# Patient Record
Sex: Female | Born: 1995 | Race: White | Hispanic: No | Marital: Single | Smoking: Never smoker
Health system: Southern US, Community
[De-identification: ages and names within clinical notes are randomized; demographics above are authoritative.]

## PROBLEM LIST (undated history)

## (undated) DIAGNOSIS — F419 Anxiety disorder, unspecified: Secondary | ICD-10-CM

## (undated) DIAGNOSIS — F32A Depression, unspecified: Secondary | ICD-10-CM

## (undated) DIAGNOSIS — K219 Gastro-esophageal reflux disease without esophagitis: Secondary | ICD-10-CM

## (undated) DIAGNOSIS — G43909 Migraine, unspecified, not intractable, without status migrainosus: Secondary | ICD-10-CM

## (undated) DIAGNOSIS — R55 Syncope and collapse: Secondary | ICD-10-CM

## (undated) DIAGNOSIS — F329 Major depressive disorder, single episode, unspecified: Secondary | ICD-10-CM

## (undated) DIAGNOSIS — Z87440 Personal history of urinary (tract) infections: Secondary | ICD-10-CM

## (undated) HISTORY — DX: Personal history of urinary (tract) infections: Z87.440

## (undated) HISTORY — DX: Major depressive disorder, single episode, unspecified: F32.9

## (undated) HISTORY — DX: Gastro-esophageal reflux disease without esophagitis: K21.9

## (undated) HISTORY — PX: UPPER GASTROINTESTINAL ENDOSCOPY: SHX188

## (undated) HISTORY — DX: Depression, unspecified: F32.A

## (undated) HISTORY — PX: UPPER GI ENDOSCOPY: SHX6162

## (undated) HISTORY — DX: Syncope and collapse: R55

## (undated) HISTORY — DX: Migraine, unspecified, not intractable, without status migrainosus: G43.909

## (undated) HISTORY — DX: Anxiety disorder, unspecified: F41.9

---
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
## 2016-10-18 ENCOUNTER — Other Ambulatory Visit: Payer: Self-pay | Admitting: Family Medicine

## 2016-10-18 DIAGNOSIS — Z3041 Encounter for surveillance of contraceptive pills: Secondary | ICD-10-CM

## 2017-05-21 ENCOUNTER — Ambulatory Visit (INDEPENDENT_AMBULATORY_CARE_PROVIDER_SITE_OTHER): Payer: 59 | Admitting: Student

## 2017-05-21 VITALS — BP 119/84 | HR 108 | Wt 154.4 lb

## 2017-05-21 DIAGNOSIS — Z30430 Encounter for insertion of intrauterine contraceptive device: Secondary | ICD-10-CM

## 2017-05-21 DIAGNOSIS — Z113 Encounter for screening for infections with a predominantly sexual mode of transmission: Secondary | ICD-10-CM

## 2017-05-21 DIAGNOSIS — Z01419 Encounter for gynecological examination (general) (routine) without abnormal findings: Secondary | ICD-10-CM

## 2017-05-21 MED ORDER — LEVONORGESTREL 20 MCG/24HR IU IUD
INTRAUTERINE_SYSTEM | Freq: Once | INTRAUTERINE | Status: AC
  Administered 2017-05-21: 16:00:00 via INTRAUTERINE

## 2017-05-21 NOTE — Addendum Note (Signed)
Addended by: Cheree Ditto, Shermika Balthaser A on: 05/21/2017 04:28 PM   Modules accepted: Orders

## 2017-05-21 NOTE — Patient Instructions (Signed)

## 2017-05-21 NOTE — Addendum Note (Signed)
Addended by: Cheree Ditto, DEMETRICE A on: 05/21/2017 04:27 PM   Modules accepted: Orders

## 2017-05-21 NOTE — Progress Notes (Signed)
  Subjective:     Patient ID: Jamie Ruiz, female   DOB: 1995-10-10, 22 y.o.   MRN: [id number]  HPI Patient Jamie Ruiz is a 22 y.o. G0P0000 Non-pregnant female here for IUD placement. Denies abnormal discharge, dysuria, abnormal vaginal bleeding, vaginal lesions or other s/s of UTI or STI. Also requesting pap smear. She denies recent unprotected intercourse. She is not currently sexually active.    Review of Systems  Constitutional: Negative.   HENT: Negative.   Respiratory: Negative.   Cardiovascular: Negative.   Gastrointestinal: Negative.   Genitourinary: Positive for vaginal bleeding.  Musculoskeletal: Negative.   Neurological: Negative.   Hematological: Negative.   Psychiatric/Behavioral: Negative.        Objective:   Physical Exam  Constitutional: She appears well-developed.  HENT:  Head: Normocephalic.  Eyes: Pupils are equal, round, and reactive to light.  Neck: Normal range of motion.  Cardiovascular: Normal rate.  Pulmonary/Chest: Effort normal.  Abdominal: Soft.  Genitourinary: Vaginal discharge found.  Genitourinary Comments: NEFG; no lesions on vaginal walls. Bloody discharge consistent with menses; no clots, odor. Cervix is pink with no lesion; no CMT, suprapubic or adnexal tenderness.   Musculoskeletal: Normal range of motion.  Neurological: She is alert.  Skin: Skin is warm.       Assessment:     1. Encounter for gynecological examination   2. Encounter for insertion of intrauterine contraceptive device (IUD)     2. Pap smear done today; along with GC CT.       Plan:       GYNECOLOGY CLINIC PROCEDURE NOTE  Jamie Ruiz is a 22 y.o. G0P0000 here for Mirena IUD insertion. No GYN concerns.  Pap smear done today.   IUD Insertion Procedure Note Patient identified, informed consent performed, consent signed.   Discussed risks of irregular bleeding, cramping, infection, malpositioning or misplacement of the IUD outside the uterus which may require  further procedure such as laparoscopy. Time out was performed.  Urine pregnancy test negative.  Speculum placed in the vagina.  Cervix visualized.  Cleaned with Betadine x 2.  Grasped anteriorly with a single tooth tenaculum.  Uterus sounded to 7 cm.  Mirena IUD placed per manufacturer's recommendations.  Strings trimmed to 3 cm. Tenaculum was removed, good hemostasis noted.  Patient tolerated procedure well.   Patient was given post-procedure instructions.  She was advised to have backup contraception for one week.  Patient was also asked to check IUD strings periodically and follow up in 4 weeks for IUD check.   Jamie Ruiz CNM

## 2017-05-22 LAB — CYTOLOGY - PAP
Chlamydia: NEGATIVE
DIAGNOSIS: NEGATIVE
Neisseria Gonorrhea: NEGATIVE

## 2017-05-26 ENCOUNTER — Other Ambulatory Visit: Payer: Self-pay | Admitting: Primary Care

## 2017-05-26 DIAGNOSIS — Z0000 Encounter for general adult medical examination without abnormal findings: Secondary | ICD-10-CM

## 2017-06-06 ENCOUNTER — Other Ambulatory Visit (INDEPENDENT_AMBULATORY_CARE_PROVIDER_SITE_OTHER): Payer: 59

## 2017-06-06 DIAGNOSIS — Z0000 Encounter for general adult medical examination without abnormal findings: Secondary | ICD-10-CM | POA: Diagnosis not present

## 2017-06-06 LAB — COMPREHENSIVE METABOLIC PANEL
ALT: 13 U/L (ref 0–35)
AST: 18 U/L (ref 0–37)
Albumin: 4.4 g/dL (ref 3.5–5.2)
Alkaline Phosphatase: 55 U/L (ref 39–117)
BILIRUBIN TOTAL: 0.5 mg/dL (ref 0.2–1.2)
BUN: 11 mg/dL (ref 6–23)
CALCIUM: 9.5 mg/dL (ref 8.4–10.5)
CHLORIDE: 103 meq/L (ref 96–112)
CO2: 28 mEq/L (ref 19–32)
Creatinine, Ser: 0.79 mg/dL (ref 0.40–1.20)
GFR: 97.53 mL/min (ref >60.00)
GLUCOSE: 94 mg/dL (ref 70–99)
Potassium: 4 mEq/L (ref 3.5–5.1)
Sodium: 137 mEq/L (ref 135–145)
Total Protein: 7.1 g/dL (ref 6.0–8.3)

## 2017-06-06 LAB — LIPID PANEL
CHOL/HDL RATIO: 5
Cholesterol: 186 mg/dL (ref 0–200)
HDL: 38.3 mg/dL — AB (ref >39.00)
LDL Cholesterol: 133 mg/dL — ABNORMAL HIGH (ref 0–99)
NONHDL: 148.13
TRIGLYCERIDES: 76 mg/dL (ref 0.0–149.0)
VLDL: 15.2 mg/dL (ref 0.0–40.0)

## 2017-06-13 ENCOUNTER — Ambulatory Visit (INDEPENDENT_AMBULATORY_CARE_PROVIDER_SITE_OTHER): Payer: 59 | Admitting: Primary Care

## 2017-06-13 ENCOUNTER — Encounter: Payer: Self-pay | Admitting: Primary Care

## 2017-06-13 VITALS — BP 112/86 | HR 105 | Temp 98.2°F | Ht 67.0 in | Wt 153.2 lb

## 2017-06-13 DIAGNOSIS — Z0000 Encounter for general adult medical examination without abnormal findings: Secondary | ICD-10-CM

## 2017-06-13 DIAGNOSIS — Z23 Encounter for immunization: Secondary | ICD-10-CM | POA: Diagnosis not present

## 2017-06-13 DIAGNOSIS — F329 Major depressive disorder, single episode, unspecified: Secondary | ICD-10-CM

## 2017-06-13 DIAGNOSIS — F419 Anxiety disorder, unspecified: Secondary | ICD-10-CM

## 2017-06-13 DIAGNOSIS — F32A Depression, unspecified: Secondary | ICD-10-CM | POA: Insufficient documentation

## 2017-06-13 DIAGNOSIS — G43101 Migraine with aura, not intractable, with status migrainosus: Secondary | ICD-10-CM | POA: Diagnosis not present

## 2017-06-13 MED ORDER — SERTRALINE HCL 25 MG PO TABS: 25.0000 mg | ORAL_TABLET | Freq: Every day | ORAL | 1 refills | Status: DC

## 2017-06-13 NOTE — Patient Instructions (Signed)
You will be contacted regarding your referral to therapy.  Please let us know if you have not been contacted within one week.   Start sertraline (Zoloft) 25 mg tablets for anxiety and depression. Start by taking 1/2 tablet for 6 days, then increase to 1 full tablet thereafter.  Please schedule a follow up appointment in 6 weeks for re-evaluation. Message me sooner if you have any problems or questions.  It was a pleasure to see you today!   Preventive Care 18-39 Years, Female Preventive care refers to lifestyle choices and visits with your health care provider that can promote health and wellness. What does preventive care include?  A yearly physical exam. This is also called an annual well check.  Dental exams once or twice a year.  Routine eye exams. Ask your health care provider how often you should have your eyes checked.  Personal lifestyle choices, including: ? Daily care of your teeth and gums. ? Regular physical activity. ? Eating a healthy diet. ? Avoiding tobacco and drug use. ? Limiting alcohol use. ? Practicing safe sex. ? Taking vitamin and mineral supplements as recommended by your health care provider. What happens during an annual well check? The services and screenings done by your health care provider during your annual well check will depend on your age, overall health, lifestyle risk factors, and family history of disease. Counseling Your health care provider may ask you questions about your:  Alcohol use.  Tobacco use.  Drug use.  Emotional well-being.  Home and relationship well-being.  Sexual activity.  Eating habits.  Work and work Statistician.  Method of birth control.  Menstrual cycle.  Pregnancy history.  Screening You may have the following tests or measurements:  Height, weight, and BMI.  Diabetes screening. This is done by checking your blood sugar (glucose) after you have not eaten for a while (fasting).  Blood  pressure.  Lipid and cholesterol levels. These may be checked every 5 years starting at age 37.  Skin check.  Hepatitis C blood test.  Hepatitis B blood test.  Sexually transmitted disease (STD) testing.  BRCA-related cancer screening. This may be done if you have a family history of breast, ovarian, tubal, or peritoneal cancers.  Pelvic exam and Pap test. This may be done every 3 years starting at age 76. Starting at age 48, this may be done every 5 years if you have a Pap test in combination with an HPV test.  Discuss your test results, treatment options, and if necessary, the need for more tests with your health care provider. Vaccines Your health care provider may recommend certain vaccines, such as:  Influenza vaccine. This is recommended every year.  Tetanus, diphtheria, and acellular pertussis (Tdap, Td) vaccine. You may need a Td booster every 10 years.  Varicella vaccine. You may need this if you have not been vaccinated.  HPV vaccine. If you are 72 or younger, you may need three doses over 6 months.  Measles, mumps, and rubella (MMR) vaccine. You may need at least one dose of MMR. You may also need a second dose.  Pneumococcal 13-valent conjugate (PCV13) vaccine. You may need this if you have certain conditions and were not previously vaccinated.  Pneumococcal polysaccharide (PPSV23) vaccine. You may need one or two doses if you smoke cigarettes or if you have certain conditions.  Meningococcal vaccine. One dose is recommended if you are age 30-21 years and a first-year college student living in a residence hall, or if you  have one of several medical conditions. You may also need additional booster doses.  Hepatitis A vaccine. You may need this if you have certain conditions or if you travel or work in places where you may be exposed to hepatitis A.  Hepatitis B vaccine. You may need this if you have certain conditions or if you travel or work in places where you may  be exposed to hepatitis B.  Haemophilus influenzae type b (Hib) vaccine. You may need this if you have certain risk factors.  Talk to your health care provider about which screenings and vaccines you need and how often you need them. This information is not intended to replace advice given to you by your health care provider. Make sure you discuss any questions you have with your health care provider. Document Released: 02/26/2001 Document Revised: 09/20/2015 Document Reviewed: 11/01/2014 Elsevier Interactive Patient Education  [employer].

## 2017-06-13 NOTE — Progress Notes (Signed)
Subjective:    Patient ID: Jamie Ruiz, female    DOB: [birth year], 22 y.o.   MRN: [id number]  HPI  Jamie Ruiz is a 22 year old female who presents today for complete physical. She'd also like to discuss anxiety and depression.   Migraines occurring twice monthly on average and will last daily for several days, lasting 30 minutes to 2 hour increments. She's taking Advil with some improvement. Migraines have been present for the last several years, worse recently. She does work on a Astronomer for school. She's seen her optometrist and does wear contacts.  She's been struggling with anxiety and depression for years, but has never been able to really talk about how she feels with anyone. Her cousin is a therapist who recommended she come speak with someone about her symptoms.   Symptoms include "feeling numb inside", wants to be alone, feels nervous when in groups, little motivation to do anything, fatigue, difficulty sleeping, difficulty focusing. She also has a fear of living alone, is worried about someone being in her apartment. She'll double check the locks on her doors and will have to take pictures of the locked doors before she goes to bed.  GAD 7 score of 21 and PHQ 9 score of 21 today. She denies SI/HI.   Immunizations: -Tetanus: Completed in 2009, due today.   Diet: She endorses a healthy diet.  Breakfast: Protein shake with fruit, english muffin with peanut butter Lunch: Wraps, bagels  Dinner: Meat, vegetables, starch Snacks: Fruit Desserts: 1-2 twice weekly Beverages: Coffee, little water, Propel, flavored sparkling water  Exercise: She is not currently exercising, sometimes walks 2 miles Eye exam: Completed in 2019 Dental exam: Completes semi-annually  Pap Smear: Completed recently, normal.   Review of Systems  Constitutional: Negative for unexpected weight change.  HENT: Negative for rhinorrhea.   Respiratory: Negative for cough and shortness of breath.     Cardiovascular: Negative for chest pain.  Gastrointestinal: Negative for constipation and diarrhea.  Genitourinary: Negative for difficulty urinating and menstrual problem.  Musculoskeletal: Negative for arthralgias and myalgias.  Skin: Negative for rash.  Allergic/Immunologic: Negative for environmental allergies.  Neurological: Positive for headaches. Negative for dizziness and numbness.  Psychiatric/Behavioral: The patient is nervous/anxious.        See HPI       Past Medical History:  Diagnosis Date  . Fainting spell   . GERD (gastroesophageal reflux disease)   . History of UTI   . Migraines      Social History   Socioeconomic History  . Marital status: Single    Spouse name: Not on file  . Number of children: Not on file  . Years of education: Not on file  . Highest education level: Not on file  Occupational History  . Not on file  Social Needs  . Financial resource strain: Not on file  . Food insecurity:    Worry: Not on file    Inability: Not on file  . Transportation needs:    Medical: Not on file    Non-medical: Not on file  Tobacco Use  . Smoking status: Never Smoker  . Smokeless tobacco: Never Used  Substance and Sexual Activity  . Alcohol use: No    Alcohol/week: 0.0 oz  . Drug use: No  . Sexual activity: Yes    Partners: Male    Birth control/protection: Pill  Lifestyle  . Physical activity:    Days per week: Not on file    Minutes  per session: Not on file  . Stress: Not on file  Relationships  . Social connections:    Talks on phone: Not on file    Gets together: Not on file    Attends religious service: Not on file    Active member of club or organization: Not on file    Attends meetings of clubs or organizations: Not on file    Relationship status: Not on file  . Intimate partner violence:    Fear of current or ex partner: Not on file    Emotionally abused: Not on file    Physically abused: Not on file    Forced sexual activity: Not  on file  Other Topics Concern  . Not on file  Social History Narrative   Attends [employer].   Studying teaching.   Enjoys spending time with her friends.    Past Surgical History:  Procedure Laterality Date  . UPPER GASTROINTESTINAL ENDOSCOPY    . UPPER GI ENDOSCOPY    . WISDOM TOOTH EXTRACTION  2017    Family History  Problem Relation Age of Onset  . Hypertension Father   . Hypertension Maternal Grandfather   . Lung cancer Maternal Grandfather   . Colon cancer Maternal Grandfather   . Pancreatic cancer Paternal Grandfather   . Syncope episode Mother     Allergies  Allergen Reactions  . Penicillins Hives  . Bactrim [Sulfamethoxazole-Trimethoprim] Rash    No current outpatient medications on file prior to visit.   No current facility-administered medications on file prior to visit.     BP 112/86   Pulse (!) 105   Temp 98.2 F (36.8 C) (Oral)   Ht  (1.702 m)   Wt 153 lb 4 oz (69.5 kg)   SpO2 99%   BMI 24.00 kg/m    Objective:   Physical Exam  Constitutional: She is oriented to person, place, and time. She appears well-nourished.  HENT:  Mouth/Throat: No oropharyngeal exudate.  Eyes: Pupils are equal, round, and reactive to light. EOM are normal.  Neck: Neck supple. No thyromegaly present.  Cardiovascular: Regular rhythm.  Sinus tachycardia  Respiratory: Effort normal and breath sounds normal.  GI: Soft. Bowel sounds are normal. There is no tenderness.  Musculoskeletal: Normal range of motion.  Neurological: She is alert and oriented to person, place, and time.  Skin: Skin is warm and dry.  Psychiatric: She has a normal mood and affect.           Assessment & Plan:

## 2017-06-13 NOTE — Assessment & Plan Note (Signed)
Chronic for years, only now able to really talk about symptoms. PHQ 9 score of 21 and GAD 7 score of 21 today.  Discussed options for treatment and recommended both therapy and medication, she agrees. Referral placed for therapy.  Rx for Zoloft 25 mg sent to pharmacy. Patient is to take 1/2 tablet daily for 6 days, then advance to 1 full tablet thereafter. We discussed possible side effects of headache, GI upset, drowsiness, and SI/HI. If thoughts of SI/HI develop, we discussed to present to the emergency immediately. Patient verbalized understanding.   Follow up in 6 weeks for re-evaluation.

## 2017-06-13 NOTE — Assessment & Plan Note (Signed)
Tdap due, provided today. Pap smear completed recently per GYN. Recommended regular exercise. Exam today overall unremarkable. See HPI for other treatment. Labs with slight elevation in LDL, likely familial. Will have her start exercising. Follow up in 1 year for CPE.

## 2017-06-13 NOTE — Assessment & Plan Note (Addendum)
Continued migraines, more frequent now. Consider starting her on low dose Propranolol for daily headaches and migraine prevention in the near future, but will avoid today as we are initiating treatment for anxiety and depression.   She did just have an IUD placed per GYN, this may help with headaches.

## 2017-06-13 NOTE — Addendum Note (Signed)
Addended by: Tawnya CrookSAMBATH, Brisa Auth on: 06/13/2017 03:35 PM   Modules accepted: Orders

## 2017-06-18 ENCOUNTER — Encounter: Payer: Self-pay | Admitting: Obstetrics and Gynecology

## 2017-06-18 ENCOUNTER — Ambulatory Visit (INDEPENDENT_AMBULATORY_CARE_PROVIDER_SITE_OTHER): Payer: 59 | Admitting: Obstetrics and Gynecology

## 2017-06-18 DIAGNOSIS — Z30431 Encounter for routine checking of intrauterine contraceptive device: Secondary | ICD-10-CM | POA: Diagnosis not present

## 2017-06-18 NOTE — Progress Notes (Signed)
GYNECOLOGY OFFICE VISIT NOTE  History:  22 y.o. G0P0000 here today for IUD string check. Mirena IUD was placed 1 month ago. She denies any abnormal vaginal discharge, pelvic pain or other concerns. Started menses Monday 06/16/2017. Having some spotting today. No dyspareunia. She is concerned that the IUD will move around during intercourse.  Past Medical History:  Diagnosis Date  . Fainting spell   . GERD (gastroesophageal reflux disease)   . History of UTI   . Migraines     Past Surgical History:  Procedure Laterality Date  . UPPER GASTROINTESTINAL ENDOSCOPY    . UPPER GI ENDOSCOPY    . WISDOM TOOTH EXTRACTION  2017    The following portions of the patient's history were reviewed and updated as appropriate: allergies, current medications, past family history, past medical history, past social history, past surgical history and problem list.   Review of Systems:  Pertinent items noted in HPI and remainder of comprehensive ROS otherwise negative.  Objective:  Physical Exam BP 132/84   Pulse (!) 123   Ht 5\' 7"  (1.702 m)   Wt 153 lb (69.4 kg)   LMP 06/16/2017   BMI 23.96 kg/m  CONSTITUTIONAL: Well-developed, well-nourished female in no acute distress.  HENT:  Normocephalic, atraumatic.  SKIN: Skin is warm and dry. No rash noted. Not diaphoretic. No erythema. No pallor. NEUROLOGIC: Alert and oriented to person, place, and time.  PSYCHIATRIC: Normal mood and affect. Normal behavior. Normal judgment and thought content. CARDIOVASCULAR: Normal heart rate noted RESPIRATORY: Effort and rate normal ABDOMEN: Soft, no distention noted.   PELVIC: Normal appearing external genitalia; normal appearing vaginal mucosa and cervix. IUD string seen. No abnormal discharge noted.    Assessment & Plan:  IUD check up - Strings in place - Reassurance given that IUD is in place and more than likely not move, but there is a very small chance of IUD migration. - Advised to check IUD strings monthly  after menses ends or at a set time monthly - Return to office in 1 year for annual exam  Please refer to After Visit Summary for other counseling recommendations.   Return in about 1 year (around 06/19/2018) for Annual Exam.   Raelyn MoraDawson, Saphire Barnhart, CNM 06/18/2017 1:14 PM

## 2017-06-18 NOTE — Patient Instructions (Signed)

## 2017-06-25 ENCOUNTER — Ambulatory Visit: Payer: 59 | Admitting: Psychology

## 2017-06-25 DIAGNOSIS — F410 Panic disorder [episodic paroxysmal anxiety] without agoraphobia: Secondary | ICD-10-CM | POA: Diagnosis not present

## 2017-06-30 ENCOUNTER — Encounter: Payer: Self-pay | Admitting: Primary Care

## 2017-07-02 ENCOUNTER — Ambulatory Visit: Payer: 59 | Admitting: Psychology

## 2017-07-02 DIAGNOSIS — F410 Panic disorder [episodic paroxysmal anxiety] without agoraphobia: Secondary | ICD-10-CM

## 2017-07-05 ENCOUNTER — Other Ambulatory Visit: Payer: Self-pay | Admitting: Primary Care

## 2017-07-05 DIAGNOSIS — F419 Anxiety disorder, unspecified: Principal | ICD-10-CM

## 2017-07-05 DIAGNOSIS — F329 Major depressive disorder, single episode, unspecified: Secondary | ICD-10-CM

## 2017-07-07 NOTE — Telephone Encounter (Signed)
Ok to change? Electronically CHANGE request for sertraline (ZOLOFT) 25 MG tablet to 90 days supply  Last prescribed on 06/13/2017  Last seen on 06/13/2017

## 2017-07-07 NOTE — Telephone Encounter (Signed)
No change. Needs to follow up as recommended and if improved then will change to 90 day supply.

## 2017-07-08 ENCOUNTER — Ambulatory Visit: Payer: 59 | Admitting: Psychology

## 2017-07-08 DIAGNOSIS — F410 Panic disorder [episodic paroxysmal anxiety] without agoraphobia: Secondary | ICD-10-CM

## 2017-07-14 ENCOUNTER — Encounter: Payer: Self-pay | Admitting: Primary Care

## 2017-07-16 ENCOUNTER — Ambulatory Visit: Payer: 59 | Admitting: Psychology

## 2017-07-16 DIAGNOSIS — F410 Panic disorder [episodic paroxysmal anxiety] without agoraphobia: Secondary | ICD-10-CM

## 2017-07-22 ENCOUNTER — Ambulatory Visit: Payer: 59 | Admitting: Psychology

## 2017-07-22 DIAGNOSIS — F410 Panic disorder [episodic paroxysmal anxiety] without agoraphobia: Secondary | ICD-10-CM | POA: Diagnosis not present

## 2017-07-25 ENCOUNTER — Ambulatory Visit: Payer: 59 | Admitting: Primary Care

## 2017-07-25 ENCOUNTER — Encounter: Payer: Self-pay | Admitting: Primary Care

## 2017-07-25 VITALS — BP 112/64 | HR 97 | Temp 98.6°F | Ht 67.0 in | Wt 152.8 lb

## 2017-07-25 DIAGNOSIS — F329 Major depressive disorder, single episode, unspecified: Secondary | ICD-10-CM | POA: Diagnosis not present

## 2017-07-25 DIAGNOSIS — F419 Anxiety disorder, unspecified: Secondary | ICD-10-CM

## 2017-07-25 DIAGNOSIS — F32A Depression, unspecified: Secondary | ICD-10-CM

## 2017-07-25 MED ORDER — SERTRALINE HCL 50 MG PO TABS
50.0000 mg | ORAL_TABLET | Freq: Every day | ORAL | 0 refills | Status: DC
Start: 2017-07-25 — End: 2017-09-01

## 2017-07-25 NOTE — Patient Instructions (Signed)
Continue taking Zoloft 50 mg daily.   Please update me in 2-3 weeks on the increased dose of Zoloft.  Continue to meet with Synetta FailAnita weekly as scheduled.   It was a pleasure to see you today!

## 2017-07-25 NOTE — Progress Notes (Signed)
Subjective:    Patient ID: [name], female    DOB: February 27, 1995, 22 y.o.   MRN: [id number]  HPI  Ms. Hirschmann is a 22 year old female who presents today for follow up of anxiety and depression.  She was last evaluated in late May for her physical with reports of chronic anxiety and depression. PHQ 9 score of 21 and GAD 7 score of 21. We discussed treatment options and we elected to start with Zoloft 25 mg and therapy.  Since her last visit she's feeling slightly better. She sent a message through the online portal with reports of no improvement in symptoms after taking Zoloft 25 mg for 4 weeks. Her dose was increased to 50 mg for which she's been taking for 2 weeks.   Since the increase in her dose to 50 mg she's noticed slight improvement. Positive effects include less fatigue, easier to get out of bed in the morning, easier to be apart of social situations. She is currently following with therapy weekly which has helped.   PHQ 9 score of 22 and GAD 7 score of 20 today. She denies headaches, GI upset, SI/HI.   Review of Systems  Respiratory: Negative for shortness of breath.   Cardiovascular: Negative for chest pain.  Gastrointestinal: Negative for abdominal pain and nausea.  Psychiatric/Behavioral: The patient is not nervous/anxious.        See HPI       Past Medical History:  Diagnosis Date  . Fainting spell   . GERD (gastroesophageal reflux disease)   . History of UTI   . Migraines      Social History   Socioeconomic History  . Marital status: Single    Spouse name: Not on file  . Number of children: Not on file  . Years of education: Not on file  . Highest education level: Not on file  Occupational History  . Not on file  Social Needs  . Financial resource strain: Not on file  . Food insecurity:    Worry: Not on file    Inability: Not on file  . Transportation needs:    Medical: Not on file    Non-medical: Not on file  Tobacco Use  . Smoking status: Never  Smoker  . Smokeless tobacco: Never Used  Substance and Sexual Activity  . Alcohol use: No    Alcohol/week: 0.0 oz  . Drug use: No  . Sexual activity: Yes    Partners: Male    Birth control/protection: Pill  Lifestyle  . Physical activity:    Days per week: Not on file    Minutes per session: Not on file  . Stress: Not on file  Relationships  . Social connections:    Talks on phone: Not on file    Gets together: Not on file    Attends religious service: Not on file    Active member of club or organization: Not on file    Attends meetings of clubs or organizations: Not on file    Relationship status: Not on file  . Intimate partner violence:    Fear of current or ex partner: Not on file    Emotionally abused: Not on file    Physically abused: Not on file    Forced sexual activity: Not on file  Other Topics Concern  . Not on file  Social History Narrative   Attends [employer].   Studying teaching.   Enjoys spending time with her friends.  Past Surgical History:  Procedure Laterality Date  . UPPER GASTROINTESTINAL ENDOSCOPY    . UPPER GI ENDOSCOPY    . WISDOM TOOTH EXTRACTION  2017    Family History  Problem Relation Age of Onset  . Hypertension Father   . Hypertension Maternal Grandfather   . Lung cancer Maternal Grandfather   . Colon cancer Maternal Grandfather   . Pancreatic cancer Paternal Grandfather   . Syncope episode Mother     Allergies  Allergen Reactions  . Penicillins Hives  . Bactrim [Sulfamethoxazole-Trimethoprim] Rash    Current Outpatient Medications on File Prior to Visit  Medication Sig Dispense Refill  . levonorgestrel (MIRENA) 20 MCG/24HR IUD 1 each by Intrauterine route once.     No current facility-administered medications on file prior to visit.     BP 112/64   Pulse 97   Temp 98.6 F (37 C) (Oral)   Ht 5\' 7"  (1.702 m)   Wt 152 lb 12 oz (69.3 kg)   SpO2 98%   BMI 23.92 kg/m    Objective:   Physical Exam    Constitutional: She appears well-nourished.  Neck: Neck supple.  Cardiovascular: Normal rate and regular rhythm.  Respiratory: Effort normal and breath sounds normal.  Skin: Skin is warm and dry.  Psychiatric: She has a normal mood and affect.           Assessment & Plan:

## 2017-07-25 NOTE — Assessment & Plan Note (Signed)
No improvement with Zoloft 25 mg, now with slight improvement on 50 mg dose which she began two weeks ago. Will have her continue on the 50 mg dose for at least another two weeks. If no continued improvement then consider an increase to 75 mg.  Continue weekly therapy.   She will update in 2-3 weeks. Denies SI/HI.

## 2017-07-30 ENCOUNTER — Ambulatory Visit: Payer: 59 | Admitting: Psychology

## 2017-08-01 ENCOUNTER — Ambulatory Visit: Payer: 59 | Admitting: Psychology

## 2017-08-01 DIAGNOSIS — F410 Panic disorder [episodic paroxysmal anxiety] without agoraphobia: Secondary | ICD-10-CM | POA: Diagnosis not present

## 2017-08-08 ENCOUNTER — Encounter: Payer: Self-pay | Admitting: Primary Care

## 2017-08-12 ENCOUNTER — Ambulatory Visit: Payer: 59 | Admitting: Psychology

## 2017-08-12 DIAGNOSIS — F410 Panic disorder [episodic paroxysmal anxiety] without agoraphobia: Secondary | ICD-10-CM | POA: Diagnosis not present

## 2017-08-31 DIAGNOSIS — F419 Anxiety disorder, unspecified: Principal | ICD-10-CM

## 2017-08-31 DIAGNOSIS — F329 Major depressive disorder, single episode, unspecified: Secondary | ICD-10-CM

## 2017-09-01 MED ORDER — SERTRALINE HCL 100 MG PO TABS: 100.0000 mg | ORAL_TABLET | Freq: Every day | ORAL | 1 refills | Status: DC

## 2017-10-01 DIAGNOSIS — F329 Major depressive disorder, single episode, unspecified: Secondary | ICD-10-CM

## 2017-10-01 DIAGNOSIS — F32A Depression, unspecified: Secondary | ICD-10-CM

## 2017-10-01 DIAGNOSIS — F419 Anxiety disorder, unspecified: Principal | ICD-10-CM

## 2017-10-03 MED ORDER — SERTRALINE HCL 100 MG PO TABS: 150.0000 mg | ORAL_TABLET | Freq: Every day | ORAL | 1 refills | Status: DC

## 2017-10-22 ENCOUNTER — Other Ambulatory Visit: Payer: Self-pay | Admitting: Primary Care

## 2017-10-22 DIAGNOSIS — F329 Major depressive disorder, single episode, unspecified: Secondary | ICD-10-CM

## 2017-10-22 DIAGNOSIS — F419 Anxiety disorder, unspecified: Principal | ICD-10-CM

## 2017-12-09 ENCOUNTER — Ambulatory Visit: Payer: 59 | Admitting: Primary Care

## 2017-12-09 ENCOUNTER — Ambulatory Visit: Payer: 59 | Admitting: Psychology

## 2017-12-09 ENCOUNTER — Encounter: Payer: Self-pay | Admitting: Primary Care

## 2017-12-09 VITALS — BP 106/70 | HR 102 | Temp 98.2°F | Ht 67.0 in | Wt 170.5 lb

## 2017-12-09 DIAGNOSIS — F410 Panic disorder [episodic paroxysmal anxiety] without agoraphobia: Secondary | ICD-10-CM | POA: Diagnosis not present

## 2017-12-09 DIAGNOSIS — F329 Major depressive disorder, single episode, unspecified: Secondary | ICD-10-CM | POA: Diagnosis not present

## 2017-12-09 DIAGNOSIS — K219 Gastro-esophageal reflux disease without esophagitis: Secondary | ICD-10-CM | POA: Diagnosis not present

## 2017-12-09 DIAGNOSIS — F419 Anxiety disorder, unspecified: Secondary | ICD-10-CM

## 2017-12-09 DIAGNOSIS — F32A Depression, unspecified: Secondary | ICD-10-CM

## 2017-12-09 NOTE — Assessment & Plan Note (Signed)
Will have her try the probiotics, if no improvement then start famotidine 20 mg. She will update.

## 2017-12-09 NOTE — Assessment & Plan Note (Signed)
Overall doing much better on sertraline at the 150 mg dose, denies SI/HI. Continue same. Follow up in 6 months.

## 2017-12-09 NOTE — Progress Notes (Signed)
Subjective:    Patient ID: Jamie Ruiz, female    DOB: 20-Dec-1995, 22 y.o.   MRN: [id number]  HPI  [name] is a 22 year old female who presents today for follow up of anxiety and depression.  She is currently managed on sertraline 150 mg. She was last evaluated in July 2019 where she was taking 50 mg that was increased from 25 mg two weeks prior. She attends a university in [place] and has been communicating symptoms via My Chart.   Since her last visit her sertraline has increased gradually up to 150 mg which is what she's taking today. Overall she's doing very well on the 150 mg dose. Positive effects include feeling less anxious and is able to be home alone in her apartment without feeling scared. She had no problems getting out of bed in the morning, has motivation to do school work and has also started exercising.   She denies SI/HI, nausea, GI upset.   She has a long history of GERD since middle school, underwent endoscopy in the past which was unremarkable. Symptoms include chest burning/pain, esophageal burning, belching. She has been off and on Zantac for years with temporary improvement. She's not taking anything OTC, her mother is wanting her to try probiotics.    Review of Systems  Respiratory: Negative for shortness of breath.   Cardiovascular: Negative for chest pain.  Gastrointestinal: Negative for abdominal pain and nausea.       Intermittent GERD symptoms   Psychiatric/Behavioral:       See HPI       Past Medical History:  Diagnosis Date  . Fainting spell   . GERD (gastroesophageal reflux disease)   . History of UTI   . Migraines      Social History   Socioeconomic History  . Marital status: Single    Spouse name: Not on file  . Number of children: Not on file  . Years of education: Not on file  . Highest education level: Not on file  Occupational History  . Not on file  Social Needs  . Financial resource strain: Not on file  . Food insecurity:    Worry: Not on file    Inability: Not on file  . Transportation needs:    Medical: Not on file    Non-medical: Not on file  Tobacco Use  . Smoking status: Never Smoker  . Smokeless tobacco: Never Used  Substance and Sexual Activity  . Alcohol use: No    Alcohol/week: 0.0 standard drinks  . Drug use: No  . Sexual activity: Yes    Partners: Male    Birth control/protection: Pill  Lifestyle  . Physical activity:    Days per week: Not on file    Minutes per session: Not on file  . Stress: Not on file  Relationships  . Social connections:    Talks on phone: Not on file    Gets together: Not on file    Attends religious service: Not on file    Active member of club or organization: Not on file    Attends meetings of clubs or organizations: Not on file    Relationship status: Not on file  . Intimate partner violence:    Fear of current or ex partner: Not on file    Emotionally abused: Not on file    Physically abused: Not on file    Forced sexual activity: Not on file  Other Topics Concern  . Not on  file  Social History Narrative   [employer]ends [employer].   Studying teaching.   Enjoys spending time with her friends.    Past Surgical History:  Procedure Laterality Date  . UPPER GASTROINTESTINAL ENDOSCOPY    . UPPER GI ENDOSCOPY    . WISDOM TOOTH EXTRACTION  2017    Family History  Problem Relation Age of Onset  . Hypertension Father   . Hypertension Maternal Grandfather   . Lung cancer Maternal Grandfather   . Colon cancer Maternal Grandfather   . Pancreatic cancer Paternal Grandfather   . Syncope episode Mother     Allergies  Allergen Reactions  . Penicillins Hives  . Bactrim [Sulfamethoxazole-Trimethoprim] Rash    Current Outpatient Medications on File Prior to Visit  Medication Sig Dispense Refill  . levonorgestrel (MIRENA) 20 MCG/24HR IUD 1 each by Intrauterine route once.    . sertraline (ZOLOFT) 100 MG tablet Take 1.5 tablets (150 mg total)  by mouth daily. 90 tablet 1   No current facility-administered medications on file prior to visit.     BP 106/70   Pulse (!) 102   Temp 98.2 F (36.8 C) (Oral)   Ht 5\' 7"  (1.702 m)   Wt 170 lb 8 oz (77.3 kg)   SpO2 98%   BMI 26.70 kg/m    Objective:   Physical Exam  Constitutional: She appears well-nourished.  Neck: Neck supple.  Cardiovascular: Normal rate and regular rhythm.  Respiratory: Effort normal and breath sounds normal.  Skin: Skin is warm and dry.  Psychiatric: She has a normal mood and affect.  Mood is much improved overall            Assessment & Plan:

## 2017-12-09 NOTE — Patient Instructions (Addendum)
Continue taking sertraline (Zoloft) 150 mg daily as prescribed.  Try the probiotics for GERD and if no improvement then try famotidine (Pepcid) 20 mg once daily. Please message me if no improvement.  Please schedule a follow up appointment in 6 months.  It was a pleasure to see you today! Good luck with finals!

## 2017-12-31 ENCOUNTER — Ambulatory Visit: Payer: 59 | Admitting: Psychology

## 2017-12-31 DIAGNOSIS — F410 Panic disorder [episodic paroxysmal anxiety] without agoraphobia: Secondary | ICD-10-CM

## 2018-01-16 DIAGNOSIS — F329 Major depressive disorder, single episode, unspecified: Secondary | ICD-10-CM

## 2018-01-16 DIAGNOSIS — F419 Anxiety disorder, unspecified: Principal | ICD-10-CM

## 2018-01-16 MED ORDER — SERTRALINE HCL 100 MG PO TABS: 150.0000 mg | ORAL_TABLET | Freq: Every day | ORAL | 1 refills | Status: DC

## 2018-02-04 ENCOUNTER — Other Ambulatory Visit: Payer: Self-pay | Admitting: Primary Care

## 2018-02-04 DIAGNOSIS — F419 Anxiety disorder, unspecified: Principal | ICD-10-CM

## 2018-02-04 DIAGNOSIS — F329 Major depressive disorder, single episode, unspecified: Secondary | ICD-10-CM

## 2018-02-04 DIAGNOSIS — R41840 Attention and concentration deficit: Secondary | ICD-10-CM

## 2018-03-23 ENCOUNTER — Other Ambulatory Visit: Payer: Self-pay

## 2018-03-23 ENCOUNTER — Other Ambulatory Visit
Admission: RE | Admit: 2018-03-23 | Discharge: 2018-03-23 | Disposition: A | Discharge status: Home / Self Care | Payer: 59 | Source: Ambulatory Visit | Attending: Psychiatry | Admitting: Psychiatry

## 2018-03-23 ENCOUNTER — Encounter: Payer: Self-pay | Admitting: Psychiatry

## 2018-03-23 ENCOUNTER — Ambulatory Visit: Payer: 59 | Admitting: Psychiatry

## 2018-03-23 VITALS — BP 120/81 | HR 89 | Temp 98.4°F | Wt 181.4 lb

## 2018-03-23 DIAGNOSIS — G4700 Insomnia, unspecified: Secondary | ICD-10-CM

## 2018-03-23 DIAGNOSIS — F330 Major depressive disorder, recurrent, mild: Secondary | ICD-10-CM | POA: Insufficient documentation

## 2018-03-23 DIAGNOSIS — F411 Generalized anxiety disorder: Secondary | ICD-10-CM | POA: Insufficient documentation

## 2018-03-23 LAB — TSH: TSH: 1.15 u[IU]/mL (ref 0.350–4.500)

## 2018-03-23 MED ORDER — TRAZODONE HCL 50 MG PO TABS: 25.0000 mg | ORAL_TABLET | Freq: Every day | ORAL | 1 refills | Status: DC

## 2018-03-23 NOTE — Progress Notes (Signed)
Psychiatric Initial Adult Assessment   Patient Identification: Jamie Ruiz MRN:  [id number] Date of Evaluation:  03/23/2018 Referral Source: Vernona Rieger NP Chief Complaint: Here to establish care Chief Complaint    Depression; Anxiety; Establish Care     Visit Diagnosis:    ICD-10-CM   1. GAD (generalized anxiety disorder) F41.1 TSH  2. MDD (major depressive disorder), recurrent episode, mild (HCC) F33.0 TSH  3. 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  Patient reports that she has been struggling with depression and anxiety since the past several months.  She describes her depressive symptoms as sadness, inability to feel emotions, sleep problems, anhedonia, social withdrawal and so on.  She reports she is currently on Zoloft which was started in the summer by her primary medical doctor.  And it has helped.  Patient reports a history of anxiety symptoms when she is worried about everything to the extreme, is restless, on edge.  She also reports having panic attacks when she has racing heart rate, chest pain and so on especially in the evenings.  She reports it can be triggered by her fear of being alone in the house or in social situations.  However she reports the Zoloft is helpful with the same.  Her anxiety symptoms have improved.  Patient however reports she has noticed attention and concentration problems and wonders whether she has ADD.  She reports several family members have ADD and she is worried about the same.  She reports she struggles with her attention, sustained focus, keeping up with projects and assignments, being forgetful, inability to keep up with appointments, losing personal belongings, zoning out and so on on a regular basis.   She reports she is struggling with this all her life and now it is more prominent.  Patient reports sleep problems.  She reports there are times when she is restless and wakes up after 3 to 4 hours.  This has been going on since the past several years.  Patient denies a history of trauma.  Patient denies any substance abuse problems.  Patient is in school to become a Runner, broadcasting/film/video and reports school is a stressor.  Patient has good social support system from her family.  Associated Signs/Symptoms: Depression Symptoms:  insomnia, difficulty concentrating, anxiety, panic attacks, (Hypo) Manic Symptoms:  denies Anxiety Symptoms:  Excessive Worry, Panic Symptoms, Social Anxiety, Psychotic Symptoms:  denies PTSD Symptoms: Negative  Past Psychiatric History: Patient denies IP admission, denies suicide attempts. Diagnosed with depression and anxiety by PMD - started on zoloft 6 months ago.  Previous Psychotropic Medications: Yes Zoloft  Substance Abuse History in the last 12 months:  No.  Consequences of Substance Abuse: Negative  Past Medical History:  Past Medical History:  Diagnosis Date  . Anxiety   . Depression   . Fainting spell   . GERD (gastroesophageal reflux disease)   . History of UTI   . Migraines     Past Surgical History:  Procedure Laterality Date  . UPPER GASTROINTESTINAL ENDOSCOPY    . UPPER GI ENDOSCOPY    . WISDOM TOOTH EXTRACTION  2017    Family Psychiatric History: Maternal aunt-ADD, 3 of her cousins-ADD, anxiety and depression  Family History:  Family History  Problem Relation Age of Onset  . Hypertension  Father   . Hypertension Maternal Grandfather   . Lung cancer Maternal Grandfather   . Colon cancer Maternal Grandfather   . Pancreatic cancer Paternal Grandfather   . Syncope episode Mother   . Anxiety disorder Maternal Aunt   . Anxiety disorder Maternal Grandmother   . Anxiety disorder Cousin   . Depression Cousin   . ADD / ADHD Cousin      Social History:   Social History   Socioeconomic History  . Marital status: Single    Spouse name: Not on file  . Number of children: 0  . Years of education: Not on file  . Highest education level: Some college, no degree  Occupational History  . Not on file  Social Needs  . Financial resource strain: Not hard at all  . Food insecurity:    Worry: Never true    Inability: Never true  . Transportation needs:    Medical: No    Non-medical: No  Tobacco Use  . Smoking status: Never Smoker  . Smokeless tobacco: Never Used  Substance and Sexual Activity  . Alcohol use: No    Alcohol/week: 0.0 standard drinks  . Drug use: No  . Sexual activity: Not Currently    Partners: Male    Birth control/protection: Pill  Lifestyle  . Physical activity:    Days per week: 1 day    Minutes per session: 60 min  . Stress: Very much  Relationships  . Social connections:    Talks on phone: Not on file    Gets together: Not on file    Attends religious service: Never    Active member of club or organization: Yes    Attends meetings of clubs or organizations: More than 4 times per year    Relationship status: Never married  Other Topics Concern  . Not on file  Social History Narrative   Attends [employer].   Studying teaching.   Enjoys spending time with her friends.    Additional Social History: Patient is single.  She is a Consulting civil engineerstudent, Holiday representativesenior at [employer].  Major in elementary education.  Her family lives in [place].  She currently lives in [place] with a roommate since she is attending school. Allergies:   Allergies  Allergen Reactions  . Penicillins Hives  . Bactrim [Sulfamethoxazole-Trimethoprim] Rash    Metabolic Disorder Labs: No results found for: HGBA1C, MPG No results found for: PROLACTIN Lab Results  Component Value Date   CHOL 186 06/06/2017   TRIG 76.0 06/06/2017   HDL 38.30 (L) 06/06/2017   CHOLHDL 5 06/06/2017   VLDL 15.2  06/06/2017   LDLCALC 133 (H) 06/06/2017   Lab Results  Component Value Date   TSH 1.150 03/23/2018    Therapeutic Level Labs: No results found for: LITHIUM No results found for: CBMZ No results found for: VALPROATE  Current Medications: Current Outpatient Medications  Medication Sig Dispense Refill  . levonorgestrel (MIRENA) 20 MCG/24HR IUD 1 each by Intrauterine route once.    . sertraline (ZOLOFT) 100 MG tablet Take 1.5 tablets (150 mg total) by mouth daily. 135 tablet 1  . traZODone (DESYREL) 50 MG tablet Take 0.5-1 tablets (25-50 mg total) by mouth at bedtime. For sleep 30 tablet 1   No current facility-administered medications for this visit.     Musculoskeletal: Strength & Muscle Tone: within normal limits Gait & Station: normal Patient leans: N/A  Psychiatric Specialty Exam: Review of Systems  Psychiatric/Behavioral: The patient is nervous/anxious and  has insomnia.   All other systems reviewed and are negative.   Blood pressure 120/81, pulse 89, temperature 98.4 F (36.9 C), temperature source Oral, weight 181 lb 6.4 oz (82.3 kg).Body mass index is 28.41 kg/m.  General Appearance: Casual  Eye Contact:  Fair  Speech:  Clear and Coherent  Volume:  Normal  Mood:  Anxious  Affect:  Congruent  Thought Process:  Goal Directed and Descriptions of Associations: Intact  Orientation:  Full (Time, Place, and Person)  Thought Content:  Logical  Suicidal Thoughts:  No  Homicidal Thoughts:  No  Memory:  Immediate;   Fair Recent;   Fair Remote;   Fair  Judgement:  Fair  Insight:  Fair  Psychomotor Activity:  Normal  Concentration:  Concentration: Fair and Attention Span: Fair  Recall:  [employer] of Knowledge:Fair  Language: Fair  Akathisia:  No  Handed:  Right  AIMS (if indicated): denies tremors,rigidity,stiffness  Assets:  Communication Skills Desire for Improvement Social Support  ADL's:  Intact  Cognition: WNL  Sleep:  Poor   Screenings: GAD-7      Office Visit from 07/25/2017 in [facility] Visit from 06/13/2017 in [facility] at [facility]  Total GAD-7 Score  20  21    PHQ2-9     Office Visit from 07/25/2017 in [facility] at [facility] Visit from 06/13/2017 in [facility] at [facility] Total Score  6  6  PHQ-9 Total Score  22  21      Assessment and Plan: Nonia is a 23 yr old CF, who is Consulting civil engineer ,lives in [place], originally from [place], presented to the clinic today to establish care.  Patient struggles with anxiety, depression and attention and concentration problems.  She is currently on medication which helped with her mood symptoms however continues to struggle with attention and focus.  Patient is biologically predisposed given her family history.  Patient is here today to rule out ADHD.  Discussed plan as noted below.  Plan MDD-improving Continue Zoloft 150 mg p.o. daily  For GAD-improving Continue Zoloft 150 mg p.o. daily.  For insomnia-unstable Discussed sleep hygiene techniques. Start trazodone 25 to 50 mg p.o. nightly as needed  For ADHD- rule out Patient filled out ADHD questionnaire- scored very high on the same.  Based on clinical evaluation it is possible patient likely has ADHD.  Will refer her for testing.  Will send a referral to [facility].  We will get the following labs-TSH-she will go to [facility].  I have reviewed medical records in [place] per her primary medical doctor-NP Levester Fresh 12/09/2017' patient currently managed on sertraline 150 mg.  She was last evaluated in July 2019 where she was taking 50 mg that was increased from 25 mg 2 weeks prior.  Overall she is doing well on the 150 mg.'  Follow-up in clinic in 1 month or sooner if needed.  I have spent atleast 60 minutes face to face with patient today. More than 50 % of the time was spent for psychoeducation and supportive psychotherapy and care  coordination.  This note was generated in part or whole with voice recognition software. Voice recognition is usually quite accurate but there are transcription errors that can and very often do occur. I apologize for any typographical errors that were not detected and corrected.          Jomarie Longs, MD 3/9/20205:25 PM

## 2018-03-23 NOTE — Patient Instructions (Signed)
Trazodone tablets What is this medicine? TRAZODONE (TRAZ oh done) is used to treat depression. This medicine may be used for other purposes; ask your health care provider or pharmacist if you have questions. COMMON BRAND NAME(S): Desyrel What should I tell my health care provider before I take this medicine? They need to know if you have any of these conditions: -attempted suicide or thinking about it -bipolar disorder -bleeding problems -glaucoma -heart disease, or previous heart attack -irregular heart beat -kidney or liver disease -low levels of sodium in the blood -an unusual or allergic reaction to trazodone, other medicines, foods, dyes or preservatives -pregnant or trying to get pregnant -breast-feeding How should I use this medicine? Take this medicine by mouth with a glass of water. Follow the directions on the prescription label. Take this medicine shortly after a meal or a light snack. Take your medicine at regular intervals. Do not take your medicine more often than directed. Do not stop taking this medicine suddenly except upon the advice of your doctor. Stopping this medicine too quickly may cause serious side effects or your condition may worsen. A special MedGuide will be given to you by the pharmacist with each prescription and refill. Be sure to read this information carefully each time. Talk to your pediatrician regarding the use of this medicine in children. Special care may be needed. Overdosage: If you think you have taken too much of this medicine contact a poison control center or emergency room at once. NOTE: This medicine is only for you. Do not share this medicine with others. What if I miss a dose? If you miss a dose, take it as soon as you can. If it is almost time for your next dose, take only that dose. Do not take double or extra doses. What may interact with this medicine? Do not take this medicine with any of the following medications: -certain medicines  for fungal infections like fluconazole, itraconazole, ketoconazole, posaconazole, voriconazole -cisapride -dofetilide -dronedarone -linezolid -MAOIs like Carbex, Eldepryl, Marplan, Nardil, and Parnate -mesoridazine -methylene blue (injected into a vein) -pimozide -saquinavir -thioridazine This medicine may also interact with the following medications: -alcohol -antiviral medicines for HIV or AIDS -aspirin and aspirin-like medicines -barbiturates like phenobarbital -certain medicines for blood pressure, heart disease, irregular heart beat -certain medicines for depression, anxiety, or psychotic disturbances -certain medicines for migraine headache like almotriptan, eletriptan, frovatriptan, naratriptan, rizatriptan, sumatriptan, zolmitriptan -certain medicines for seizures like carbamazepine and phenytoin -certain medicines for sleep -certain medicines that treat or prevent blood clots like dalteparin, enoxaparin, warfarin -digoxin -fentanyl -lithium -NSAIDS, medicines for pain and inflammation, like ibuprofen or naproxen -other medicines that prolong the QT interval (cause an abnormal heart rhythm) -rasagiline -supplements like St. John's wort, kava kava, valerian -tramadol -tryptophan This list may not describe all possible interactions. Give your health care provider a list of all the medicines, herbs, non-prescription drugs, or dietary supplements you use. Also tell them if you smoke, drink alcohol, or use illegal drugs. Some items may interact with your medicine. What should I watch for while using this medicine? Tell your doctor if your symptoms do not get better or if they get worse. Visit your doctor or health care professional for regular checks on your progress. Because it may take several weeks to see the full effects of this medicine, it is important to continue your treatment as prescribed by your doctor. Patients and their families should watch out for new or worsening  thoughts of suicide or depression. Also watch   out for sudden changes in feelings such as feeling anxious, agitated, panicky, irritable, hostile, aggressive, impulsive, severely restless, overly excited and hyperactive, or not being able to sleep. If this happens, especially at the beginning of treatment or after a change in dose, call your health care professional. You may get drowsy or dizzy. Do not drive, use machinery, or do anything that needs mental alertness until you know how this medicine affects you. Do not stand or sit up quickly, especially if you are an older patient. This reduces the risk of dizzy or fainting spells. Alcohol may interfere with the effect of this medicine. Avoid alcoholic drinks. This medicine may cause dry eyes and blurred vision. If you wear contact lenses you may feel some discomfort. Lubricating drops may help. See your eye doctor if the problem does not go away or is severe. Your mouth may get dry. Chewing sugarless gum, sucking hard candy and drinking plenty of water may help. Contact your doctor if the problem does not go away or is severe. What side effects may I notice from receiving this medicine? Side effects that you should report to your doctor or health care professional as soon as possible: -allergic reactions like skin rash, itching or hives, swelling of the face, lips, or tongue -elevated mood, decreased need for sleep, racing thoughts, impulsive behavior -confusion -fast, irregular heartbeat -feeling faint or lightheaded, falls -feeling agitated, angry, or irritable -loss of balance or coordination -painful or prolonged erections -restlessness, pacing, inability to keep still -suicidal thoughts or other mood changes -tremors -trouble sleeping -seizures -unusual bleeding or bruising Side effects that usually do not require medical attention (report to your doctor or health care professional if they continue or are bothersome): -change in sex drive or  performance -change in appetite or weight -constipation -headache -muscle aches or pains -nausea This list may not describe all possible side effects. Call your doctor for medical advice about side effects. You may report side effects to FDA at 1-800-FDA-1088. Where should I keep my medicine? Keep out of the reach of children. Store at room temperature between 15 and 30 degrees C (59 to 86 degrees F). Protect from light. Keep container tightly closed. Throw away any unused medicine after the expiration date. NOTE: This sheet is a summary. It may not cover all possible information. If you have questions about this medicine, talk to your doctor, pharmacist, or health care provider.  2019 Elsevier/Gold Standard (2017-03-11 17:51:24)  

## 2018-03-25 ENCOUNTER — Telehealth: Payer: Self-pay

## 2018-03-25 NOTE — Telephone Encounter (Signed)
received fax that patient appt is set up for  05-01-18 @3 :00pm

## 2018-04-14 ENCOUNTER — Other Ambulatory Visit: Payer: Self-pay | Admitting: Psychiatry

## 2018-04-14 DIAGNOSIS — G4700 Insomnia, unspecified: Secondary | ICD-10-CM

## 2018-04-29 ENCOUNTER — Ambulatory Visit (INDEPENDENT_AMBULATORY_CARE_PROVIDER_SITE_OTHER): Payer: 59 | Admitting: Psychology

## 2018-04-29 DIAGNOSIS — F410 Panic disorder [episodic paroxysmal anxiety] without agoraphobia: Secondary | ICD-10-CM

## 2018-05-01 ENCOUNTER — Encounter: Payer: Self-pay | Admitting: Psychiatry

## 2018-05-01 ENCOUNTER — Other Ambulatory Visit: Payer: Self-pay

## 2018-05-01 ENCOUNTER — Ambulatory Visit (INDEPENDENT_AMBULATORY_CARE_PROVIDER_SITE_OTHER): Payer: 59 | Admitting: Psychiatry

## 2018-05-01 DIAGNOSIS — G4700 Insomnia, unspecified: Secondary | ICD-10-CM

## 2018-05-01 DIAGNOSIS — F330 Major depressive disorder, recurrent, mild: Secondary | ICD-10-CM

## 2018-05-01 DIAGNOSIS — R41840 Attention and concentration deficit: Secondary | ICD-10-CM | POA: Diagnosis not present

## 2018-05-01 DIAGNOSIS — F411 Generalized anxiety disorder: Secondary | ICD-10-CM

## 2018-05-01 NOTE — Progress Notes (Signed)
Virtual Visit via Telephone Note  I connected with Jamie Ruiz on 05/01/18 at 11:00 AM EDT by telephone and verified that I am speaking with the correct person using two identifiers.   I discussed the limitations, risks, security and privacy concerns of performing an evaluation and management service by telephone and the availability of in person appointments. I also discussed with the patient that there may be a patient responsible charge related to this service. The patient expressed understanding and agreed to proceed.   I discussed the assessment and treatment plan with the patient. The patient was provided an opportunity to ask questions and all were answered. The patient agreed with the plan and demonstrated an understanding of the instructions.   The patient was advised to call back or seek an in-person evaluation if the symptoms worsen or if the condition fails to improve as anticipated.   BH MD OP Progress Note  05/01/2018 12:21 PM Jamie Ruiz  MRN:  [id number]  Chief Complaint:  Chief Complaint    Follow-up     HPI: Jamie Ruiz is a 23 year old Caucasian female, single, student at [employer], [place], has a history of MDD, GAD, insomnia, attention and concentration deficit, was evaluated by phone today.  Patient today reports she is currently back home in [place] with her family.  She reports she is currently doing online classes due to the COVID-19 outbreak.  She reports everybody in her home is working remotely.  She reports she is mildly anxious about the crisis.  She however has been coping okay.  She reports she has been compliant with her Zoloft .  She reports that the Zoloft is helpful with her anxiety symptoms and depression.  She denies any side effects.  She reports she had not tried the trazodone up until a few days ago.  She reports that she had her ADHD testing done on Tuesday and per report was asked to correct her sleep and anxiety symptoms.  She is  advised to go back for another testing in a month or so.  She reports she had started taking the trazodone to help her sleep problems.  She reports that has helped a lot.  She denies any side effects.  She is currently in psychotherapy sessions with her therapist Ms. Sander Radon and sees her weekly.  She reports therapy sessions is going well.  Patient denies any suicidality, homicidality or perceptual disturbances.   Visit Diagnosis:    ICD-10-CM   1. GAD (generalized anxiety disorder) F41.1   2. MDD (major depressive disorder), recurrent episode, mild (HCC) F33.0   3. Insomnia, unspecified type G47.00   4. Attention and concentration deficit R41.840     Past Psychiatric History: Reviewed past psychiatric history from my progress note on 03/23/2018.  Past trials of Zoloft  Past Medical History:  Past Medical History:  Diagnosis Date  . Anxiety   . Depression   . Fainting spell   . GERD (gastroesophageal reflux disease)   . History of UTI   . Migraines     Past Surgical History:  Procedure Laterality Date  . UPPER GASTROINTESTINAL ENDOSCOPY    . UPPER GI ENDOSCOPY    . WISDOM TOOTH EXTRACTION  2017    Family Psychiatric History: Reviewed family psychiatric history from my progress note on 03/23/2018.  Family History:  Family History  Problem Relation Age of Onset  . Hypertension Father   . Hypertension Maternal Grandfather   . Lung cancer Maternal Grandfather   .  Colon cancer Maternal Grandfather   . Pancreatic cancer Paternal Grandfather   . Syncope episode Mother   . Anxiety disorder Maternal Aunt   . Anxiety disorder Maternal Grandmother   . Anxiety disorder Cousin   . Depression Cousin   . ADD / ADHD Cousin     Social History:  Social History   Socioeconomic History  . Marital status: Single    Spouse name: Not on file  . Number of children: 0  . Years of education: Not on file  . Highest education level: Some college, no degree  Occupational History  .  Not on file  Social Needs  . Financial resource strain: Not hard at all  . Food insecurity:    Worry: Never true    Inability: Never true  . Transportation needs:    Medical: No    Non-medical: No  Tobacco Use  . Smoking status: Never Smoker  . Smokeless tobacco: Never Used  Substance and Sexual Activity  . Alcohol use: No    Alcohol/week: 0.0 standard drinks  . Drug use: No  . Sexual activity: Not Currently    Partners: Male    Birth control/protection: Pill  Lifestyle  . Physical activity:    Days per week: 1 day    Minutes per session: 60 min  . Stress: Very much  Relationships  . Social connections:    Talks on phone: Not on file    Gets together: Not on file    Attends religious service: Never    Active member of club or organization: Yes    Attends meetings of clubs or organizations: More than 4 times per year    Relationship status: Never married  Other Topics Concern  . Not on file  Social History Narrative   Attends [employer].   Studying teaching.   Enjoys spending time with her friends.    Allergies:  Allergies  Allergen Reactions  . Penicillins Hives  . Bactrim [Sulfamethoxazole-Trimethoprim] Rash    Metabolic Disorder Labs: No results found for: HGBA1C, MPG No results found for: PROLACTIN Lab Results  Component Value Date   CHOL 186 06/06/2017   TRIG 76.0 06/06/2017   HDL 38.30 (L) 06/06/2017   CHOLHDL 5 06/06/2017   VLDL 15.2 06/06/2017   LDLCALC 133 (H) 06/06/2017   Lab Results  Component Value Date   TSH 1.150 03/23/2018    Therapeutic Level Labs: No results found for: LITHIUM No results found for: VALPROATE No components found for:  CBMZ  Current Medications: Current Outpatient Medications  Medication Sig Dispense Refill  . levonorgestrel (MIRENA) 20 MCG/24HR IUD 1 each by Intrauterine route once.    . sertraline (ZOLOFT) 100 MG tablet Take 1.5 tablets (150 mg total) by mouth daily. 135 tablet 1  . traZODone  (DESYREL) 50 MG tablet TAKE 0.5-1 TABLETS (25-50 MG TOTAL) BY MOUTH AT BEDTIME. FOR SLEEP 90 tablet 1   No current facility-administered medications for this visit.      Musculoskeletal: Strength & Muscle Tone: UTA Gait & Station: UTA Patient leans: N/A  Psychiatric Specialty Exam: Review of Systems  Psychiatric/Behavioral: The patient is nervous/anxious.   All other systems reviewed and are negative.   There were no vitals taken for this visit.There is no height or weight on file to calculate BMI.  General Appearance: UTA  Eye Contact:  UTA  Speech:  Clear and Coherent  Volume:  Normal  Mood:  Anxious  Affect:  Congruent  Thought Process:  Goal  Directed and Descriptions of Associations: Intact  Orientation:  Full (Time, Place, and Person)  Thought Content: Logical   Suicidal Thoughts:  No  Homicidal Thoughts:  No  Memory:  Immediate;   Fair Recent;   Fair Remote;   Fair  Judgement:  Fair  Insight:  Fair  Psychomotor Activity:  UTA  Concentration:  Concentration: Fair and Attention Span: Fair  Recall:  [employer] of Knowledge: Fair  Language: Fair  Akathisia:  No  Handed:  Right  AIMS (if indicated): UTA  Assets:  Communication Skills Desire for Improvement Social Support  ADL's:  Intact  Cognition: WNL  Sleep:  Improving   Screenings: GAD-7     Office Visit from 07/25/2017 in [facility] Visit from 06/13/2017 in [facility] at [facility]  Total GAD-7 Score  20  21    PHQ2-9     Office Visit from 07/25/2017 in [facility] Visit from 06/13/2017 in [facility] at [facility] Total Score  6  6  PHQ-9 Total Score  22  21       Assessment and Plan: 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  Patient struggles with depression anxiety attention and concentration  problems.  Patient however is currently making progress on the current medication regimen.  Plan as noted below.  Plan MDD-improving Zoloft 150 mg p.o. daily  For GAD-improving Zoloft 150 mg p.o. daily Continue psychotherapy sessions with therapist Ms. Sander Radon.  For insomnia-improving Trazodone 25-50 mg p.o. nightly as needed  For attention and concentration deficits-patient had first testing done at [facility].  Pending report.  She reports she was advised to return after a month for a retest.  She reports she was advised to start working on her sleep and anxiety symptoms in the meantime.  Follow-up in clinic in 6 weeks or sooner if needed.  Patient reports she wants to return after her second testing at [facility].  Discussed with patient we have not received any testing report from [facility].  She will request from them again.  I have spent atleast 15 minutes non face to face with patient today. More than 50 % of the time was spent for psychoeducation and supportive psychotherapy and care coordination.  This note was generated in part or whole with voice recognition software. Voice recognition is usually quite accurate but there are transcription errors that can and very often do occur. I apologize for any typographical errors that were not detected and corrected.        Jomarie Longs, MD 05/01/2018, 12:21 PM

## 2018-05-01 NOTE — Progress Notes (Signed)
TC on  05-01-18 @ 9:33 pt medication and pharmacy were reviewed with no updated. Pt is  Allergies were reviewed with no updates. Pt medical and surgical hx was reviewed with no updated. No vitals take because this is a phone visit.

## 2018-05-05 ENCOUNTER — Telehealth: Payer: Self-pay

## 2018-05-05 DIAGNOSIS — F5105 Insomnia due to other mental disorder: Secondary | ICD-10-CM

## 2018-05-05 MED ORDER — MIRTAZAPINE 7.5 MG PO TABS: 7.5000 mg | ORAL_TABLET | Freq: Every day | ORAL | 2 refills | Status: DC

## 2018-05-05 NOTE — Telephone Encounter (Signed)
Returned call to patient . Discussed starting Remeron for sleep. Send it to pharmacy.

## 2018-05-05 NOTE — Telephone Encounter (Signed)
pt states she can not take the trazodone it is giving her migrain headaches. needs to know what to do

## 2018-05-06 ENCOUNTER — Ambulatory Visit (INDEPENDENT_AMBULATORY_CARE_PROVIDER_SITE_OTHER): Payer: 59 | Admitting: Psychology

## 2018-05-06 DIAGNOSIS — F410 Panic disorder [episodic paroxysmal anxiety] without agoraphobia: Secondary | ICD-10-CM

## 2018-05-13 ENCOUNTER — Other Ambulatory Visit: Payer: Self-pay | Admitting: Psychiatry

## 2018-05-13 DIAGNOSIS — F5105 Insomnia due to other mental disorder: Secondary | ICD-10-CM

## 2018-05-14 ENCOUNTER — Ambulatory Visit (INDEPENDENT_AMBULATORY_CARE_PROVIDER_SITE_OTHER): Payer: 59 | Admitting: Psychology

## 2018-05-14 DIAGNOSIS — F410 Panic disorder [episodic paroxysmal anxiety] without agoraphobia: Secondary | ICD-10-CM

## 2018-05-28 ENCOUNTER — Ambulatory Visit (INDEPENDENT_AMBULATORY_CARE_PROVIDER_SITE_OTHER): Payer: 59 | Admitting: Psychology

## 2018-05-28 DIAGNOSIS — F411 Generalized anxiety disorder: Secondary | ICD-10-CM | POA: Diagnosis not present

## 2018-06-11 ENCOUNTER — Ambulatory Visit (INDEPENDENT_AMBULATORY_CARE_PROVIDER_SITE_OTHER): Payer: 59 | Admitting: Psychology

## 2018-06-11 DIAGNOSIS — F410 Panic disorder [episodic paroxysmal anxiety] without agoraphobia: Secondary | ICD-10-CM

## 2018-06-26 ENCOUNTER — Ambulatory Visit (INDEPENDENT_AMBULATORY_CARE_PROVIDER_SITE_OTHER): Payer: 59 | Admitting: Psychology

## 2018-06-26 ENCOUNTER — Ambulatory Visit: Payer: 59 | Admitting: Psychiatry

## 2018-06-26 ENCOUNTER — Ambulatory Visit (INDEPENDENT_AMBULATORY_CARE_PROVIDER_SITE_OTHER): Payer: 59 | Admitting: Psychiatry

## 2018-06-26 ENCOUNTER — Encounter: Payer: Self-pay | Admitting: Psychiatry

## 2018-06-26 ENCOUNTER — Other Ambulatory Visit: Payer: Self-pay

## 2018-06-26 DIAGNOSIS — F5105 Insomnia due to other mental disorder: Secondary | ICD-10-CM | POA: Diagnosis not present

## 2018-06-26 DIAGNOSIS — R41840 Attention and concentration deficit: Secondary | ICD-10-CM

## 2018-06-26 DIAGNOSIS — F410 Panic disorder [episodic paroxysmal anxiety] without agoraphobia: Secondary | ICD-10-CM | POA: Diagnosis not present

## 2018-06-26 DIAGNOSIS — F330 Major depressive disorder, recurrent, mild: Secondary | ICD-10-CM | POA: Diagnosis not present

## 2018-06-26 DIAGNOSIS — F411 Generalized anxiety disorder: Secondary | ICD-10-CM | POA: Diagnosis not present

## 2018-06-26 MED ORDER — SERTRALINE HCL 100 MG PO TABS: 150.0000 mg | ORAL_TABLET | Freq: Every day | ORAL | 1 refills | Status: DC

## 2018-06-26 NOTE — Progress Notes (Signed)
Virtual Visit via Video Note  I connected with Jamie Ruiz on 06/26/18 at  8:45 AM EDT by a video enabled telemedicine application and verified that I am speaking with the correct person using two identifiers.   I discussed the limitations of evaluation and management by telemedicine and the availability of in person appointments. The patient expressed understanding and agreed to proceed.     I discussed the assessment and treatment plan with the patient. The patient was provided an opportunity to ask questions and all were answered. The patient agreed with the plan and demonstrated an understanding of the instructions.   The patient was advised to call back or seek an in-person evaluation if the symptoms worsen or if the condition fails to improve as anticipated.   BH MD  OP Progress Note  06/26/2018 12:33 PM Jamie DeedsSydney Ruiz  MRN:  [id number]  Chief Complaint:  Chief Complaint    Follow-up     HPI: Jamie Ruiz is a 23 year old Caucasian female, single, student at [employer], [place], has a history of MDD, GAD, insomnia, attention concentration deficit was evaluated by telemedicine today.  Patient today reports she is currently doing better with regards to her depression and anxiety.  She reports the Zoloft and mirtazapine is helpful.  Her sleep has definitely improved on the mirtazapine.  She denies any side effects to these medications.  Patient reports she had 1 more appointment with [facility].  She reports at that time she was advised to do more testing and after she did the testing it was recommended that she give more time on her current antidepressant and return for another testing after a few weeks.  Patient reports she is kind of frustrated with this since she was told that they cannot yet give her a definitive diagnosis of ADHD.  Patient reports she continues to struggle with her attention and focus.  Patient reports she is returning to go to school  for a week next week however will be back at her parents home after that.  Patient denies any suicidality, homicidality or perceptual disturbances.  Patient denies any other concerns today.   Visit Diagnosis:    ICD-10-CM   1. GAD (generalized anxiety disorder)  F41.1   2. MDD (major depressive disorder), recurrent episode, mild (HCC)  F33.0   3. Insomnia due to mental condition  F51.05   4. Attention deficit  R41.840 sertraline (ZOLOFT) 100 MG tablet    Past Psychiatric History: I have reviewed past psychiatric history from my progress note on 03/23/2018.  Past trials of Zoloft.  Past Medical History:  Past Medical History:  Diagnosis Date  . Anxiety   . Depression   . Fainting spell   . GERD (gastroesophageal reflux disease)   . History of UTI   . Migraines     Past Surgical History:  Procedure Laterality Date  . UPPER GASTROINTESTINAL ENDOSCOPY    . UPPER GI ENDOSCOPY    . WISDOM TOOTH EXTRACTION  2017    Family Psychiatric History: I have reviewed family psychiatric history from my progress note on 03/23/2018.  Family History:  Family History  Problem Relation Age of Onset  . Hypertension Father   . Hypertension Maternal Grandfather   . Lung cancer Maternal Grandfather   . Colon cancer Maternal Grandfather   . Pancreatic cancer Paternal Grandfather   . Syncope episode Mother   . Anxiety disorder Maternal Aunt   . Anxiety disorder Maternal Grandmother   . Anxiety disorder Cousin   .  Depression Cousin   . ADD / ADHD Cousin     Social History: I have reviewed social history from my progress note on 03/23/2018. Social History   Socioeconomic History  . Marital status: Single    Spouse name: Not on file  . Number of children: 0  . Years of education: Not on file  . Highest education level: Some college, no degree  Occupational History  . Not on file  Social Needs  . Financial resource strain: Not hard at all  . Food insecurity    Worry: Never true     Inability: Never true  . Transportation needs    Medical: No    Non-medical: No  Tobacco Use  . Smoking status: Never Smoker  . Smokeless tobacco: Never Used  Substance and Sexual Activity  . Alcohol use: No    Alcohol/week: 0.0 standard drinks  . Drug use: No  . Sexual activity: Not Currently    Partners: Male    Birth control/protection: Pill  Lifestyle  . Physical activity    Days per week: 1 day    Minutes per session: 60 min  . Stress: Very much  Relationships  . Social Herbalist on phone: Not on file    Gets together: Not on file    Attends religious service: Never    Active member of club or organization: Yes    Attends meetings of clubs or organizations: More than 4 times per year    Relationship status: Never married  Other Topics Concern  . Not on file  Social History Narrative   Attends [employer].   Studying teaching.   Enjoys spending time with her friends.    Allergies:  Allergies  Allergen Reactions  . Penicillins Hives  . Bactrim [Sulfamethoxazole-Trimethoprim] Rash    Metabolic Disorder Labs: No results found for: HGBA1C, MPG No results found for: PROLACTIN Lab Results  Component Value Date   CHOL 186 06/06/2017   TRIG 76.0 06/06/2017   HDL 38.30 (L) 06/06/2017   CHOLHDL 5 06/06/2017   VLDL 15.2 06/06/2017   LDLCALC 133 (H) 06/06/2017   Lab Results  Component Value Date   TSH 1.150 03/23/2018    Therapeutic Level Labs: No results found for: LITHIUM No results found for: VALPROATE No components found for:  CBMZ  Current Medications: Current Outpatient Medications  Medication Sig Dispense Refill  . levonorgestrel (MIRENA) 20 MCG/24HR IUD 1 each by Intrauterine route once.    . mirtazapine (REMERON) 7.5 MG tablet TAKE 1 TABLET (7.5 MG TOTAL) BY MOUTH AT BEDTIME. FOR SLEEP 90 tablet 1  . sertraline (ZOLOFT) 100 MG tablet Take 1.5 tablets (150 mg total) by mouth daily. 135 tablet 1   No current  facility-administered medications for this visit.      Musculoskeletal: Strength & Muscle Tone: within normal limits Gait & Station: normal Patient leans: N/A  Psychiatric Specialty Exam: Review of Systems  Psychiatric/Behavioral: The patient is nervous/anxious (improving).   All other systems reviewed and are negative.   There were no vitals taken for this visit.There is no height or weight on file to calculate BMI.  General Appearance: Casual  Eye Contact:  Fair  Speech:  Clear and Coherent  Volume:  Normal  Mood:  Anxious improving  Affect:  Appropriate  Thought Process:  Goal Directed and Descriptions of Associations: Intact  Orientation:  Full (Time, Place, and Person)  Thought Content: Logical   Suicidal Thoughts:  No  Homicidal  Thoughts:  No  Memory:  Immediate;   Fair Recent;   Fair Remote;   Fair  Judgement:  Fair  Insight:  Fair  Psychomotor Activity:  Normal  Concentration:  Concentration: Fair and Attention Span: Fair  Recall:  [employer]  Fund of Knowledge: Fair  Language: Fair  Akathisia:  No  Handed:  Right  AIMS (if indicated): denies tremors rigidty  Assets:  Communication Skills Desire for Improvement Social Support  ADL's:  Intact  Cognition: WNL  Sleep:  Fair   Screenings: GAD-7     [facility]  Total GAD-7 Score  20  21    PHQ2-9     [facility]  PHQ-2 Total Score  6  6  PHQ-9 Total Score  22  21       Assessment and Plan: Jamie Ruiz is a 23 year old Caucasian 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  Patient is currently making progress on the current medication regimen except for her attention and  concentration.  Patient was referred to [facility] for ADHD testing and currently continues to follow-up with them.  Discussed adding Wellbutrin to help with her attention however she declined at this time.  We will continue to monitor closely.  Plan as noted below.  Plan MDD-improving Zoloft 150 mg p.o. daily  For GAD-improving Zoloft as prescribed Continue psychotherapy session with therapist Ms. Sander RadonAnita Pedro  For insomnia-improving Mirtazapine 7.5 mg p.o. nightly.  For attention concentration deficit- patient had testing done at [facility].  Reviewed the first report with patient.  Patient was advised to work on her sleep and anxiety symptoms.  Patient reports she had another testing done however pending report. Discussed adding Wellbutrin however patient wants to give it more time.  Follow-up in clinic in 4 to 6 weeks or sooner if needed.  August 11 at 10:30 AM  I have spent atleast 15 minutes non face to face with patient today. More than 50 % of the time was spent for psychoeducation and supportive psychotherapy and care coordination.  This note was generated in part or whole with voice recognition software. Voice recognition is usually quite accurate but there are transcription errors that can and very often do occur. I apologize for any typographical errors that were not detected and corrected.        Jomarie LongsSaramma Barbarann Kelly, MD 06/26/2018, 12:33 PM

## 2018-08-14 ENCOUNTER — Ambulatory Visit (INDEPENDENT_AMBULATORY_CARE_PROVIDER_SITE_OTHER): Payer: 59 | Admitting: Psychology

## 2018-08-14 DIAGNOSIS — F410 Panic disorder [episodic paroxysmal anxiety] without agoraphobia: Secondary | ICD-10-CM | POA: Diagnosis not present

## 2018-08-25 ENCOUNTER — Other Ambulatory Visit: Payer: Self-pay

## 2018-08-25 ENCOUNTER — Encounter: Payer: Self-pay | Admitting: Psychiatry

## 2018-08-25 ENCOUNTER — Ambulatory Visit (INDEPENDENT_AMBULATORY_CARE_PROVIDER_SITE_OTHER): Payer: 59 | Admitting: Psychiatry

## 2018-08-25 DIAGNOSIS — F5105 Insomnia due to other mental disorder: Secondary | ICD-10-CM | POA: Insufficient documentation

## 2018-08-25 DIAGNOSIS — F411 Generalized anxiety disorder: Secondary | ICD-10-CM

## 2018-08-25 DIAGNOSIS — R41840 Attention and concentration deficit: Secondary | ICD-10-CM | POA: Diagnosis not present

## 2018-08-25 DIAGNOSIS — F330 Major depressive disorder, recurrent, mild: Secondary | ICD-10-CM | POA: Insufficient documentation

## 2018-08-25 NOTE — Progress Notes (Signed)
Virtual Visit via Video Note  I connected with Jamie DeedsSydney Ruiz on 08/25/18 at 10:30 AM EDT by a video enabled telemedicine application and verified that I am speaking with the correct person using two identifiers.   I discussed the limitations of evaluation and management by telemedicine and the availability of in person appointments. The patient expressed understanding and agreed to proceed.    I discussed the assessment and treatment plan with the patient. The patient was provided an opportunity to ask questions and all were answered. The patient agreed with the plan and demonstrated an understanding of the instructions.   The patient was advised to call back or seek an in-person evaluation if the symptoms worsen or if the condition fails to improve as anticipated.   BH MD OP Progress Note  08/25/2018 12:06 PM Jamie DeedsSydney Hepp  MRN:  [id number]  Chief Complaint:  Chief Complaint    Follow-up     HPI: Jamie Ruiz is a 23 year old Caucasian female, single, student at [employer], [place], has a history of MDD, GAD, insomnia, attention deficit was evaluated by telemedicine today.  Patient today reports she is about to start her school again.  She reports it is going to be a hybrid schedule and she has to attend life classes every other week.  She reports she is excited about going back to school.  She reports she has been making progress on the current medication regimen.  She reports sleep is good.  She does continue to struggle with some anxiety symptoms on and off however it is mostly because of school starting back, COVID-19 pandemic and a lot of unknowns.  She continues to work with her therapist and that is going well.  Patient reports she did not have retesting at [facility] yet since they want her to go back to school and to return after a few weeks.  Patient denies any suicidality, homicidality or perceptual disturbances.  She denies any other concerns  today. Visit Diagnosis:    ICD-10-CM   1. GAD (generalized anxiety disorder)  F41.1   2. MDD (major depressive disorder), recurrent episode, mild (HCC)  F33.0   3. Insomnia due to mental condition  F51.05   4. Attention deficit  R41.840     Past Psychiatric History: I have reviewed past psychiatric history from my progress note on 03/23/2018.  Past trials of Zoloft  Past Medical History:  Past Medical History:  Diagnosis Date  . Anxiety   . Depression   . Fainting spell   . GERD (gastroesophageal reflux disease)   . History of UTI   . Migraines     Past Surgical History:  Procedure Laterality Date  . UPPER GASTROINTESTINAL ENDOSCOPY    . UPPER GI ENDOSCOPY    . WISDOM TOOTH EXTRACTION  2017    Family Psychiatric History: I have reviewed family psychiatric history from my progress note on 03/23/2018  Family History:  Family History  Problem Relation Age of Onset  . Hypertension Father   . Hypertension Maternal Grandfather   . Lung cancer Maternal Grandfather   . Colon cancer Maternal Grandfather   . Pancreatic cancer Paternal Grandfather   . Syncope episode Mother   . Anxiety disorder Maternal Aunt   . Anxiety disorder Maternal Grandmother   . Anxiety disorder Cousin   . Depression Cousin   . ADD / ADHD Cousin     Social History: I have reviewed social history from my progress note on 03/23/2018 Social History  Socioeconomic History  . Marital status: Single    Spouse name: Not on file  . Number of children: 0  . Years of education: Not on file  . Highest education level: Some college, no degree  Occupational History  . Not on file  Social Needs  . Financial resource strain: Not hard at all  . Food insecurity    Worry: Never true    Inability: Never true  . Transportation needs    Medical: No    Non-medical: No  Tobacco Use  . Smoking status: Never Smoker  . Smokeless tobacco: Never Used  Substance and Sexual Activity  . Alcohol use: No    Alcohol/week:  0.0 standard drinks  . Drug use: No  . Sexual activity: Not Currently    Partners: Male    Birth control/protection: Pill  Lifestyle  . Physical activity    Days per week: 1 day    Minutes per session: 60 min  . Stress: Very much  Relationships  . Social Herbalist on phone: Not on file    Gets together: Not on file    Attends religious service: Never    Active member of club or organization: Yes    Attends meetings of clubs or organizations: More than 4 times per year    Relationship status: Never married  Other Topics Concern  . Not on file  Social History Narrative   Attends [employer].   Studying teaching.   Enjoys spending time with her friends.    Allergies:  Allergies  Allergen Reactions  . Penicillins Hives  . Bactrim [Sulfamethoxazole-Trimethoprim] Rash    Metabolic Disorder Labs: No results found for: HGBA1C, MPG No results found for: PROLACTIN Lab Results  Component Value Date   CHOL 186 06/06/2017   TRIG 76.0 06/06/2017   HDL 38.30 (L) 06/06/2017   CHOLHDL 5 06/06/2017   VLDL 15.2 06/06/2017   LDLCALC 133 (H) 06/06/2017   Lab Results  Component Value Date   TSH 1.150 03/23/2018    Therapeutic Level Labs: No results found for: LITHIUM No results found for: VALPROATE No components found for:  CBMZ  Current Medications: Current Outpatient Medications  Medication Sig Dispense Refill  . levonorgestrel (MIRENA) 20 MCG/24HR IUD 1 each by Intrauterine route once.    . mirtazapine (REMERON) 7.5 MG tablet TAKE 1 TABLET (7.5 MG TOTAL) BY MOUTH AT BEDTIME. FOR SLEEP 90 tablet 1  . sertraline (ZOLOFT) 100 MG tablet Take 1.5 tablets (150 mg total) by mouth daily. 135 tablet 1   No current facility-administered medications for this visit.      Musculoskeletal: Strength & Muscle Tone: UTA Gait & Station: normal Patient leans: N/A  Psychiatric Specialty Exam: Review of Systems  Psychiatric/Behavioral: The patient is  nervous/anxious.   All other systems reviewed and are negative.   There were no vitals taken for this visit.There is no height or weight on file to calculate BMI.  General Appearance: Casual  Eye Contact:  Fair  Speech:  Clear and Coherent  Volume:  Normal  Mood:  Anxious improving  Affect:  Appropriate  Thought Process:  Goal Directed and Descriptions of Associations: Intact  Orientation:  Full (Time, Place, and Person)  Thought Content: Logical   Suicidal Thoughts:  No  Homicidal Thoughts:  No  Memory:  Immediate;   Fair Recent;   Fair Remote;   Fair  Judgement:  Fair  Insight:  Good  Psychomotor Activity:  Normal  Concentration:  Concentration: Good and Attention Span: Fair  Recall:  [employer]Fair  Fund of Knowledge: Fair  Language: Fair  Akathisia:  No  Handed:  Right  AIMS (if indicated):denies tremors, rigidity  Assets:  Communication Skills Desire for Improvement Social Support  ADL's:  Intact  Cognition: WNL  Sleep:  Fair   Screenings: GAD-7     [facility]  Total GAD-7 Score  20  21    PHQ2-9     [facility]  PHQ-2 Total Score  6  6  PHQ-9 Total Score  22  21       Assessment and Plan: Jamie Ruiz is a 23 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  Patient is currently making progress on the current medication regimen however continues to struggle with some attention problems.  She will continue to work with [facility] for the same.  Continue plan as noted below.  Plan For MDD-improving Zoloft 150 mg p.o. daily  For GAD-improving Zoloft as prescribed Continue psychotherapy  sessions with therapist Ms. Sander RadonAnita Pedro  For insomnia-improving Mirtazapine 7.5 mg p.o. nightly  For attention and concentration deficit- follow-up with [facility] as needed  Follow-up in clinic in 4 to 6 weeks or sooner if needed.  September 18 at 10:30 AM  I have spent atleast 15 minutes non face to face with patient today. More than 50 % of the time was spent for psychoeducation and supportive psychotherapy and care coordination.  This note was generated in part or whole with voice recognition software. Voice recognition is usually quite accurate but there are transcription errors that can and very often do occur. I apologize for any typographical errors that were not detected and corrected.       Jomarie LongsSaramma Damarrion Mimbs, MD 08/25/2018, 12:06 PM

## 2018-08-26 ENCOUNTER — Ambulatory Visit: Payer: 59 | Admitting: Psychology

## 2018-10-02 ENCOUNTER — Ambulatory Visit (INDEPENDENT_AMBULATORY_CARE_PROVIDER_SITE_OTHER): Payer: 59 | Admitting: Psychiatry

## 2018-10-02 ENCOUNTER — Encounter: Payer: Self-pay | Admitting: Psychiatry

## 2018-10-02 ENCOUNTER — Other Ambulatory Visit: Payer: Self-pay

## 2018-10-02 DIAGNOSIS — F5105 Insomnia due to other mental disorder: Secondary | ICD-10-CM

## 2018-10-02 DIAGNOSIS — F411 Generalized anxiety disorder: Secondary | ICD-10-CM | POA: Diagnosis not present

## 2018-10-02 DIAGNOSIS — R41840 Attention and concentration deficit: Secondary | ICD-10-CM

## 2018-10-02 DIAGNOSIS — F330 Major depressive disorder, recurrent, mild: Secondary | ICD-10-CM | POA: Diagnosis not present

## 2018-10-02 NOTE — Progress Notes (Signed)
Virtual Visit via Video Note  I connected with Jamie Ruiz on 10/02/18 at 10:30 AM EDT by a video enabled telemedicine application and verified that I am speaking with the correct person using two identifiers.   I discussed the limitations of evaluation and management by telemedicine and the availability of in person appointments. The patient expressed understanding and agreed to proceed.    I discussed the assessment and treatment plan with the patient. The patient was provided an opportunity to ask questions and all were answered. The patient agreed with the plan and demonstrated an understanding of the instructions.   The patient was advised to call back or seek an in-person evaluation if the symptoms worsen or if the condition fails to improve as anticipated.  BH MD OP Progress Note  10/02/2018 12:49 PM Jamie Ruiz  MRN:  [id number]  Chief Complaint:  Chief Complaint    Follow-up     HPI: Jamie Ruiz is a 23 year old Caucasian female, single, student at [employer], [place], has a history of MDD, GAD, insomnia, attention deficit was evaluated by telemedicine today.  Patient today reports she is currently back at grad school.  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  She reports she has to be there in person and that has been stressful.  She reports she has to wear a mask all day.  She currently has a group of 10 students at a time.  So far she has been managing okay however it is still anxiety provoking.  Patient reports sleep is good on the mirtazapine.  She continues to struggle with attention and focus on and off, she plans to return to [facility] soon.  Patient is compliant on her Zoloft.  Patient denies any suicidal, homicidality or perceptual disturbances.  Patient denies any other concerns today. Visit Diagnosis:    ICD-10-CM   1. GAD (generalized anxiety disorder)  F41.1   2.  MDD (major depressive disorder), recurrent episode, mild (HCC)  F33.0   3. Insomnia due to mental condition  F51.05   4. Attention deficit  R41.840     Past Psychiatric History: I have reviewed past psychiatric history from my progress note on 03/23/2018.  Past trials of Zoloft  Past Medical History:  Past Medical History:  Diagnosis Date  . Anxiety   . Depression   . Fainting spell   . GERD (gastroesophageal reflux disease)   . History of UTI   . Migraines     Past Surgical History:  Procedure Laterality Date  . UPPER GASTROINTESTINAL ENDOSCOPY    . UPPER GI ENDOSCOPY    . WISDOM TOOTH EXTRACTION  2017    Family Psychiatric History: I have reviewed family psychiatric history from my progress note on 03/23/2018  Family History:  Family History  Problem Relation Age of Onset  . Hypertension Father   . Hypertension Maternal Grandfather   . Lung cancer Maternal Grandfather   . Colon cancer Maternal Grandfather   . Pancreatic cancer Paternal Grandfather   . Syncope episode Mother   . Anxiety disorder Maternal Aunt   . Anxiety disorder Maternal Grandmother   . Anxiety disorder Cousin   . Depression Cousin   . ADD / ADHD Cousin     Social History: I have reviewed social history from my progress note on 03/23/2018 Social History   Socioeconomic History  . Marital status: Single    Spouse name: Not on file  .  Number of children: 0  . Years of education: Not on file  . Highest education level: Some college, no degree  Occupational History  . Not on file  Social Needs  . Financial resource strain: Not hard at all  . Food insecurity    Worry: Never true    Inability: Never true  . Transportation needs    Medical: No    Non-medical: No  Tobacco Use  . Smoking status: Never Smoker  . Smokeless tobacco: Never Used  Substance and Sexual Activity  . Alcohol use: No    Alcohol/week: 0.0 standard drinks  . Drug use: No  . Sexual activity: Not Currently    Partners: Male     Birth control/protection: Pill  Lifestyle  . Physical activity    Days per week: 1 day    Minutes per session: 60 min  . Stress: Very much  Relationships  . Social Herbalist on phone: Not on file    Gets together: Not on file    Attends religious service: Never    Active member of club or organization: Yes    Attends meetings of clubs or organizations: More than 4 times per year    Relationship status: Never married  Other Topics Concern  . Not on file  Social History Narrative   Attends [employer].   Studying teaching.   Enjoys spending time with her friends.    Allergies:  Allergies  Allergen Reactions  . Penicillins Hives  . Bactrim [Sulfamethoxazole-Trimethoprim] Rash    Metabolic Disorder Labs: No results found for: HGBA1C, MPG No results found for: PROLACTIN Lab Results  Component Value Date   CHOL 186 06/06/2017   TRIG 76.0 06/06/2017   HDL 38.30 (L) 06/06/2017   CHOLHDL 5 06/06/2017   VLDL 15.2 06/06/2017   LDLCALC 133 (H) 06/06/2017   Lab Results  Component Value Date   TSH 1.150 03/23/2018    Therapeutic Level Labs: No results found for: LITHIUM No results found for: VALPROATE No components found for:  CBMZ  Current Medications: Current Outpatient Medications  Medication Sig Dispense Refill  . levonorgestrel (MIRENA) 20 MCG/24HR IUD 1 each by Intrauterine route once.    . mirtazapine (REMERON) 7.5 MG tablet TAKE 1 TABLET (7.5 MG TOTAL) BY MOUTH AT BEDTIME. FOR SLEEP 90 tablet 1  . sertraline (ZOLOFT) 100 MG tablet Take 1.5 tablets (150 mg total) by mouth daily. 135 tablet 1   No current facility-administered medications for this visit.      Musculoskeletal: Strength & Muscle Tone: UTA Gait & Station: normal Patient leans: N/A  Psychiatric Specialty Exam: Review of Systems  Psychiatric/Behavioral: The patient is nervous/anxious.   All other systems reviewed and are negative.   There were no vitals taken for  this visit.There is no height or weight on file to calculate BMI.  General Appearance: Casual  Eye Contact:  Good  Speech:  Clear and Coherent  Volume:  Normal  Mood:  Anxious improving  Affect:  Congruent  Thought Process:  Goal Directed and Descriptions of Associations: Intact  Orientation:  Full (Time, Place, and Person)  Thought Content: Logical   Suicidal Thoughts:  No  Homicidal Thoughts:  No  Memory:  Immediate;   Fair Recent;   Fair Remote;   Fair  Judgement:  Fair  Insight:  Fair  Psychomotor Activity:  Normal  Concentration:  Concentration: Fair and Attention Span: Fair  Recall:  [employer] of Knowledge: Fair  Language: Fair  Akathisia:  No  Handed:  Right  AIMS (if indicated): Denies tremors, rigidity  Assets:  Communication Skills Desire for Improvement Housing Social Support  ADL's:  Intact  Cognition: WNL  Sleep:  Fair   Screenings: GAD-7     [facility]  Total GAD-7 Score  20  21    PHQ2-9     [facility]  PHQ-2 Total Score  6  6  PHQ-9 Total Score  22  21       Assessment and Plan: Jamie AlesSydney is a 23 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  Patient continues to have psychosocial stressors of the current pandemic as well as being in grad school.  She continues to struggle with attention however her mood symptoms are currently making progress.  She will continue to need medication management and psychotherapy sessions.  Plan as noted below.  Plan For MDD-improving Zoloft 150 mg p.o. daily  GAD-improving Zoloft as prescribed Continue psychotherapy sessions with therapist Ms. Sander RadonAnita Pedro.  For  insomnia-improving Mirtazapine 7.5 mg p.o. nightly  For attention concentration deficit-follow-up with [facility] as needed.  Follow-up in clinic in 6 to 8 weeks or sooner if needed.  November 18 at 4:15 PM  I have spent atleast 15 minutes non face to face with patient today. More than 50 % of the time was spent for psychoeducation and supportive psychotherapy and care coordination. This note was generated in part or whole with voice recognition software. Voice recognition is usually quite accurate but there are transcription errors that can and very often do occur. I apologize for any typographical errors that were not detected and corrected.       Jomarie LongsSaramma Liann Spaeth, MD 10/02/2018, 12:49 PM

## 2018-10-28 ENCOUNTER — Telehealth: Payer: Self-pay

## 2018-10-28 DIAGNOSIS — F330 Major depressive disorder, recurrent, mild: Secondary | ICD-10-CM

## 2018-10-28 DIAGNOSIS — F5105 Insomnia due to other mental disorder: Secondary | ICD-10-CM

## 2018-10-28 DIAGNOSIS — F411 Generalized anxiety disorder: Secondary | ICD-10-CM

## 2018-10-28 MED ORDER — DOXEPIN HCL 10 MG PO CAPS: 10.0000 mg | ORAL_CAPSULE | Freq: Every evening | ORAL | 1 refills | Status: DC | PRN

## 2018-10-28 NOTE — Telephone Encounter (Signed)
Discussed starting Doxepin for sleep since she does not want anything that causes weight gain. Stop Remeron.

## 2018-10-28 NOTE — Telephone Encounter (Signed)
pt doesnt want to take the remeron because it is making her gain weight and she wanted to know if she can try seroquel.

## 2018-11-08 ENCOUNTER — Other Ambulatory Visit: Payer: Self-pay | Admitting: Psychiatry

## 2018-12-01 ENCOUNTER — Telehealth: Payer: Self-pay

## 2018-12-01 NOTE — Telephone Encounter (Signed)
Received fax report on r/o adhd

## 2018-12-02 ENCOUNTER — Ambulatory Visit (INDEPENDENT_AMBULATORY_CARE_PROVIDER_SITE_OTHER): Payer: 59 | Admitting: Psychiatry

## 2018-12-02 ENCOUNTER — Encounter: Payer: Self-pay | Admitting: Psychiatry

## 2018-12-02 ENCOUNTER — Other Ambulatory Visit: Payer: Self-pay

## 2018-12-02 DIAGNOSIS — F411 Generalized anxiety disorder: Secondary | ICD-10-CM

## 2018-12-02 DIAGNOSIS — F5105 Insomnia due to other mental disorder: Secondary | ICD-10-CM

## 2018-12-02 DIAGNOSIS — F3342 Major depressive disorder, recurrent, in full remission: Secondary | ICD-10-CM

## 2018-12-02 DIAGNOSIS — R41840 Attention and concentration deficit: Secondary | ICD-10-CM

## 2018-12-02 MED ORDER — SERTRALINE HCL 100 MG PO TABS: 150.0000 mg | ORAL_TABLET | Freq: Every day | ORAL | 1 refills | Status: DC

## 2018-12-02 MED ORDER — DOXEPIN HCL 10 MG PO CAPS: 10.0000 mg | ORAL_CAPSULE | Freq: Every evening | ORAL | 1 refills | Status: DC | PRN

## 2018-12-02 NOTE — Progress Notes (Signed)
Virtual Visit via Video Note  I connected with Jerilynn Som on 12/02/18 at  4:15 PM EST by a video enabled telemedicine application and verified that I am speaking with the correct person using two identifiers.   I discussed the limitations of evaluation and management by telemedicine and the availability of in person appointments. The patient expressed understanding and agreed to proceed.     I discussed the assessment and treatment plan with the patient. The patient was provided an opportunity to ask questions and all were answered. The patient agreed with the plan and demonstrated an understanding of the instructions.   The patient was advised to call back or seek an in-person evaluation if the symptoms worsen or if the condition fails to improve as anticipated.  [place] [facility] MD OP Progress Note  12/02/2018 4:49 PM Kelise Kuch  MRN:  [id number]  Chief Complaint:  Chief Complaint    Follow-up     HPI: Ailed is a 23 year old Caucasian female, single, student at [employer] in [place], originally from [place], [place], has a history of MDD, GAD, insomnia, attention deficit was evaluated by telemedicine today.  Patient today reports she continues to be at grad school at this time.  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  So far everything is going well even though it can get stressful at times.  Patient reports her mood, depression and anxiety are stable on the current medication regimen.  She is compliant on the Zoloft.  She denies side effects.  She reports sleep is improved on the doxepin.  She currently takes 20 mg as needed.  Patient denies any suicidality, homicidality or perceptual disturbances.  Patient has not been able to touch base with her therapist since she has been at school.  She is trying to find a therapist closer to where she lives now.  With the pandemic it is kind of hard.  She is planning to spend her Thanksgiving  holidays with her grandmother in [place] .  She denies any other concerns today.  Visit Diagnosis:    ICD-10-CM   1. GAD (generalized anxiety disorder)  F41.1    STABLE  2. MDD (major depressive disorder), recurrent, in full remission ([place])  F33.42 doxepin (SINEQUAN) 10 MG capsule  3. Insomnia due to mental condition  F51.05 doxepin (SINEQUAN) 10 MG capsule   STABLE  4. Attention deficit  R41.840 sertraline (ZOLOFT) 100 MG tablet    Past Psychiatric History: Reviewed past psychiatric history from my progress note on 03/23/2018.  Past trials of Zoloft  Past Medical History:  Past Medical History:  Diagnosis Date  . Anxiety   . Depression   . Fainting spell   . GERD (gastroesophageal reflux disease)   . History of UTI   . Migraines     Past Surgical History:  Procedure Laterality Date  . UPPER GASTROINTESTINAL ENDOSCOPY    . UPPER GI ENDOSCOPY    . WISDOM TOOTH EXTRACTION  2017    Family Psychiatric History: Reviewed family psychiatric history from my progress note on 03/23/2018  Family History:  Family History  Problem Relation Age of Onset  . Hypertension Father   . Hypertension Maternal Grandfather   . Lung cancer Maternal Grandfather   . Colon cancer Maternal Grandfather   . Pancreatic cancer Paternal Grandfather   . Syncope episode Mother   . Anxiety disorder Maternal Aunt   . Anxiety disorder Maternal Grandmother   . Anxiety disorder  Cousin   . Depression Cousin   . ADD / ADHD Cousin     Social History: Reviewed social history from my progress note on 03/23/2018 Social History   Socioeconomic History  . Marital status: Single    Spouse name: Not on file  . Number of children: 0  . Years of education: Not on file  . Highest education level: Some college, no degree  Occupational History  . Not on file  Social Needs  . Financial resource strain: Not hard at all  . Food insecurity    Worry: Never true    Inability: Never true  . Transportation needs     Medical: No    Non-medical: No  Tobacco Use  . Smoking status: Never Smoker  . Smokeless tobacco: Never Used  Substance and Sexual Activity  . Alcohol use: No    Alcohol/week: 0.0 standard drinks  . Drug use: No  . Sexual activity: Not Currently    Partners: Male    Birth control/protection: Pill  Lifestyle  . Physical activity    Days per week: 1 day    Minutes per session: 60 min  . Stress: Very much  Relationships  . Social Musicianconnections    Talks on phone: Not on file    Gets together: Not on file    Attends religious service: Never    Active member of club or organization: Yes    Attends meetings of clubs or organizations: More than 4 times per year    Relationship status: Never married  Other Topics Concern  . Not on file  Social History Narrative   Attends [employer].   Studying teaching.   Enjoys spending time with her friends.    Allergies:  Allergies  Allergen Reactions  . Penicillins Hives  . Bactrim [Sulfamethoxazole-Trimethoprim] Rash    Metabolic Disorder Labs: No results found for: HGBA1C, MPG No results found for: PROLACTIN Lab Results  Component Value Date   CHOL 186 06/06/2017   TRIG 76.0 06/06/2017   HDL 38.30 (L) 06/06/2017   CHOLHDL 5 06/06/2017   VLDL 15.2 06/06/2017   LDLCALC 133 (H) 06/06/2017   Lab Results  Component Value Date   TSH 1.150 03/23/2018    Therapeutic Level Labs: No results found for: LITHIUM No results found for: VALPROATE No components found for:  CBMZ  Current Medications: Current Outpatient Medications  Medication Sig Dispense Refill  . doxepin (SINEQUAN) 10 MG capsule Take 1-2 capsules (10-20 mg total) by mouth at bedtime as needed. sleep 180 capsule 1  . levonorgestrel (MIRENA) 20 MCG/24HR IUD 1 each by Intrauterine route once.    . sertraline (ZOLOFT) 100 MG tablet Take 1.5 tablets (150 mg total) by mouth daily. 135 tablet 1   No current facility-administered medications for this visit.       Musculoskeletal: Strength & Muscle Tone: UTA Gait & Station: normal Patient leans: N/A  Psychiatric Specialty Exam: Review of Systems  Psychiatric/Behavioral: Negative for depression, hallucinations, substance abuse and suicidal ideas. The patient is not nervous/anxious and does not have insomnia.   All other systems reviewed and are negative.   There were no vitals taken for this visit.There is no height or weight on file to calculate BMI.  General Appearance: Casual  Eye Contact:  Fair  Speech:  Clear and Coherent  Volume:  Normal  Mood:  Euthymic  Affect:  Appropriate  Thought Process:  Goal Directed and Descriptions of Associations: Intact  Orientation:  Full (Time, Place,  and Person)  Thought Content: Logical   Suicidal Thoughts:  No  Homicidal Thoughts:  No  Memory:  Immediate;   Fair Recent;   Fair Remote;   Fair  Judgement:  Fair  Insight:  Good  Psychomotor Activity:  Normal  Concentration:  Concentration: Fair and Attention Span: Fair  Recall:  [employer] of Knowledge: Fair  Language: Fair  Akathisia:  No  Handed:  Right  AIMS (if indicated): denies tremors, rigidity  Assets:  Communication Skills Desire for Improvement Financial Resources/Insurance Housing Social Support Talents/Skills  ADL's:  Intact  Cognition: WNL  Sleep:  Fair   Screenings: GAD-7     Office Visit from 07/25/2017 in [facility] Visit from 06/13/2017 in [facility] at [facility]  Total GAD-7 Score  20  21    PHQ2-9     Office Visit from 07/25/2017 in [facility] at [facility] Visit from 06/13/2017 in [facility] at [facility] Total Score  6  6  PHQ-9 Total Score  22  21       Assessment and Plan: Jonesha is a 23 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  Patient continues  to have psychosocial stressors of the current pandemic as well as being in grad school.  She however is currently doing well on the current medication regimen.  Plan MDD in remission Zoloft 150 mg p.o. daily  GAD-stable Zoloft as prescribed Patient advised to restart psychotherapy sessions that she will establish care with therapist close to where she lives.  Insomnia-stable Doxepin 10 to 20 mg p.o. nightly as needed Advised patient to use melatonin as needed.  For attention/concentration deficit-patient was referred to [facility].  She will continue to follow-up with them as needed  Follow-up in clinic in 2 months or sooner if needed.  January 20 at 4 PM  I have spent atleast 15 minutes non face to face with patient today. More than 50 % of the time was spent for psychoeducation and supportive psychotherapy and care coordination. This note was generated in part or whole with voice recognition software. Voice recognition is usually quite accurate but there are transcription errors that can and very often do occur. I apologize for any typographical errors that were not detected and corrected.       Jomarie Longs, MD 12/02/2018, 4:49 PM

## 2018-12-25 ENCOUNTER — Ambulatory Visit (INDEPENDENT_AMBULATORY_CARE_PROVIDER_SITE_OTHER): Payer: BC Managed Care – PPO | Admitting: Primary Care

## 2018-12-25 ENCOUNTER — Ambulatory Visit (INDEPENDENT_AMBULATORY_CARE_PROVIDER_SITE_OTHER)
Admission: RE | Admit: 2018-12-25 | Discharge: 2018-12-25 | Disposition: A | Discharge status: Home / Self Care | Payer: BC Managed Care – PPO | Source: Ambulatory Visit | Attending: Primary Care | Admitting: Primary Care

## 2018-12-25 ENCOUNTER — Other Ambulatory Visit: Payer: Self-pay

## 2018-12-25 ENCOUNTER — Encounter: Payer: Self-pay | Admitting: Primary Care

## 2018-12-25 VITALS — BP 120/76 | HR 107 | Temp 97.3°F | Ht 67.0 in | Wt 192.0 lb

## 2018-12-25 DIAGNOSIS — G8929 Other chronic pain: Secondary | ICD-10-CM

## 2018-12-25 DIAGNOSIS — M25511 Pain in right shoulder: Secondary | ICD-10-CM | POA: Diagnosis not present

## 2018-12-25 DIAGNOSIS — M25561 Pain in right knee: Secondary | ICD-10-CM

## 2018-12-25 NOTE — Progress Notes (Signed)
Subjective:    Patient ID: Jamie Ruiz, female    DOB: December 26, 1995, 23 y.o.   MRN: [id number]  HPI  Jamie Ruiz is a 23 year old female who presents today with a chief complaint of knee and shoulder pain.  1) Chronic Knee Pain: Significant history of chronic right knee pain since middle school. 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 She wore a brace for 5-6 years, also completed PT. Symptoms stabilized but never with resolve.   Since then she's had intermittent waxing and waning with increased pain over the last one year. Swelling to the right knee. Her pain is located to the entire knee, right hip pain with radiation down right lower extremity to toes. Also with intermittent numbness to foot and toes. Over the last 2-3 months pain is worse than ever.   She will elevate her leg with occasional improvement. She's not taken anything OTC for symptoms. She does weight bearing exercises with squats four days weekly. She denies injury/trauma, back pain, bowel changes.  2) Shoulder Pain: Chronic since high school, occasional "popping" to right elbow, numbness to 4th and 5th digit. She's plays tennis once monthly now, did play often during her younger years. She denies trauma/injury, neck pain, back pain, left sided pain. She's had some weakness as she finds that she can't hold her niece with her right arm as long as she can with her left.    Review of Systems  Genitourinary:       Denies bowel/bladder changes  Musculoskeletal: Positive for arthralgias.       See HPI  Neurological: Positive for numbness.       Past Medical History:  Diagnosis Date  . Anxiety   . Depression   . Fainting spell   . GERD (gastroesophageal reflux disease)   . History of UTI   . Migraines      Social History   Socioeconomic History  . Marital status: Single    Spouse name: Not on file  . Number of children: 0  . Years of education: Not on file  . Highest  education level: Some college, no degree  Occupational History  . Not on file  Tobacco Use  . Smoking status: Never Smoker  . Smokeless tobacco: Never Used  Substance and Sexual Activity  . Alcohol use: No    Alcohol/week: 0.0 standard drinks  . Drug use: No  . Sexual activity: Not Currently    Partners: Male    Birth control/protection: Pill  Other Topics Concern  . Not on file  Social History Narrative   Attends [employer].   Studying teaching.   Enjoys spending time with her friends.   Social Determinants of Health   Financial Resource Strain: Low Risk   . Difficulty of Paying Living Expenses: Not hard at all  Food Insecurity: No Food Insecurity  . Worried About Programme researcher, broadcasting/film/video in the Last Year: Never true  . Ran Out of Food in the Last Year: Never true  Transportation Needs: No Transportation Needs  . Lack of Transportation (Medical): No  . Lack of Transportation (Non-Medical): No  Physical Activity: Insufficiently Active  . Days of Exercise per Week: 1 day  . Minutes of Exercise per Session: 60 min  Stress: Stress Concern Present  . Feeling of Stress : Very much  Social Connections: Unknown  . Frequency of Communication with Friends and Family: Not on file  . Frequency of Social Gatherings with  Friends and Family: Not on file  . Attends Religious Services: Never  . Active Member of Clubs or Organizations: Yes  . Attends Archivist Meetings: More than 4 times per year  . Marital Status: Never married  Intimate Partner Violence: Not At Risk  . Fear of Current or Ex-Partner: No  . Emotionally Abused: No  . Physically Abused: No  . Sexually Abused: No    Past Surgical History:  Procedure Laterality Date  . UPPER GASTROINTESTINAL ENDOSCOPY    . UPPER GI ENDOSCOPY    . WISDOM TOOTH EXTRACTION  2017    Family History  Problem Relation Age of Onset  . Hypertension Father   . Hypertension Maternal Grandfather   . Lung cancer Maternal  Grandfather   . Colon cancer Maternal Grandfather   . Pancreatic cancer Paternal Grandfather   . Syncope episode Mother   . Anxiety disorder Maternal Aunt   . Anxiety disorder Maternal Grandmother   . Anxiety disorder Cousin   . Depression Cousin   . ADD / ADHD Cousin     Allergies  Allergen Reactions  . Penicillins Hives  . Bactrim [Sulfamethoxazole-Trimethoprim] Rash    Current Outpatient Medications on File Prior to Visit  Medication Sig Dispense Refill  . doxepin (SINEQUAN) 10 MG capsule Take 1-2 capsules (10-20 mg total) by mouth at bedtime as needed. sleep 180 capsule 1  . levonorgestrel (MIRENA) 20 MCG/24HR IUD 1 each by Intrauterine route once.    . sertraline (ZOLOFT) 100 MG tablet Take 1.5 tablets (150 mg total) by mouth daily. 135 tablet 1   No current facility-administered medications on file prior to visit.    BP 120/76   Pulse (!) 107   Temp (!) 97.3 F (36.3 C) (Temporal)   Ht 5\' 7"  (1.702 m)   Wt 192 lb (87.1 kg)   SpO2 98%   BMI 30.07 kg/m    Objective:   Physical Exam  Constitutional: She appears well-nourished.  Cardiovascular: Normal rate.  Respiratory: Effort normal.  Musculoskeletal:     Right shoulder: Pain present. No deformity or tenderness. Normal range of motion.     Right knee: No swelling or deformity. Normal range of motion. No tenderness.     Comments: Strength equal bilaterally to upper and lower extremities. Normal ROM.  Neurological:  Reflex Scores:      Patellar reflexes are 2+ on the right side. Skin: Skin is warm and dry.           Assessment & Plan:

## 2018-12-25 NOTE — Assessment & Plan Note (Addendum)
Chronic since middle school, with radiculopathy.   Plain films today.  Discussed treatment options, PT, referral to ortho. She declines PT at this time.   Referral to othropedics placed today.   Follow-up pending x-ray results.   Agree with plan, Pleas Koch, NP

## 2018-12-25 NOTE — Progress Notes (Signed)
   Subjective:    Patient ID: Jamie Ruiz, female    DOB: Jan 16, 1995, 23 y.o.   MRN: [id number]  HPI  Jamie Ruiz is a 23 year old female with a history of migraines, GERD, anxiety, depression and insomnia, who presents today with a chief complaint of R knee pain.   1) Chronic R knee pain: History of chronic R knee pain since middle school. She followed with orthopedics and was told she had baker's cysts behind the R knee, and a displaced patella. She wore a special brace for 5 or 6 years and participated in PT after which, her symptoms stabilized, but did not resolve.   Over the past year, the pain in her R knee has worsened to include pain to her R hip that travels down her leg, with intermittent numbness to her R toes and R foot. The numbness lasts 20 minutes to several hours. She also reports swelling to the R knee, however, this is chronic and has not worsened.   The pain to her R knee has intensified over the last 2-3 months. She has not taken any medication for the pain. She rates it as a 10/10 at it's worst, but it never drops below a 2-3/10.  2) Chronic R shoulder pain/numbness of the R 4th and 5th digits: She also has chronic R shoulder pain that as been ongoing since high school. She says that her R elbow will occasionally pop and her R 4th and 5th digit will "go numb." The numbness usually subsides rapidly with movement and stretching.   She is active and plays tennis once a month and performs weight bearing exercises with squats four days a week. She denies injury or trauma, gait changes, back pain, bowel or bladder incontinence.    Review of Systems  Constitutional: Negative for activity change.  Gastrointestinal:       Denies bladder incontinence  Genitourinary:       Denies bowel incontinence  Musculoskeletal: Positive for arthralgias and joint swelling (Chronic R knee swelling per pt). Negative for back pain and neck pain.       Chronic R knee pain with radiculopathy. Chronic  R shoulder pain with radiculopathy.   Neurological: Positive for weakness (Mild R arm weakness with chronic R shoulder pain) and numbness (R foot/toes and R 4th and 5th hand digit).       Objective:   Physical Exam Cardiovascular:     Pulses:          Dorsalis pedis pulses are 2+ on the right side.       Posterior tibial pulses are 2+ on the right side.  Musculoskeletal:        General: Normal range of motion.     Right hip: Normal.     Left hip: Normal.     Right upper leg: Normal.     Left upper leg: Normal.     Right lower leg: Normal.     Right foot: Normal.     Left foot: Normal.     Comments: Normal strength bilaterally   Skin:    General: Skin is warm and dry.  Neurological:     Mental Status: She is alert and oriented to person, place, and time.           Assessment & Plan:

## 2018-12-25 NOTE — Patient Instructions (Signed)
You will be contacted regarding your referral to orthopedics.  Please let us know if you have not been contacted within two weeks.   Complete xray(s) prior to leaving today. I will notify you of your results once received.  It was a pleasure to see you today!

## 2018-12-25 NOTE — Assessment & Plan Note (Addendum)
Chronic since middle school, with radiculopathy.   Plain films today.  Discussed treatment options, PT, referral to ortho. She declines PT at this time.   Referral to othropedics placed today.   Follow-up pending x-ray results.   Agree with plan, Katherine K Clark, NP  

## 2018-12-28 ENCOUNTER — Ambulatory Visit: Payer: 59 | Admitting: Primary Care

## 2019-02-03 ENCOUNTER — Ambulatory Visit (INDEPENDENT_AMBULATORY_CARE_PROVIDER_SITE_OTHER): Payer: BC Managed Care – PPO | Admitting: Psychiatry

## 2019-02-03 ENCOUNTER — Ambulatory Visit: Payer: Self-pay | Admitting: Psychiatry

## 2019-02-03 ENCOUNTER — Encounter: Payer: Self-pay | Admitting: Psychiatry

## 2019-02-03 ENCOUNTER — Other Ambulatory Visit: Payer: Self-pay

## 2019-02-03 DIAGNOSIS — F411 Generalized anxiety disorder: Secondary | ICD-10-CM | POA: Diagnosis not present

## 2019-02-03 DIAGNOSIS — F5105 Insomnia due to other mental disorder: Secondary | ICD-10-CM | POA: Diagnosis not present

## 2019-02-03 DIAGNOSIS — R41840 Attention and concentration deficit: Secondary | ICD-10-CM

## 2019-02-03 DIAGNOSIS — F3342 Major depressive disorder, recurrent, in full remission: Secondary | ICD-10-CM | POA: Insufficient documentation

## 2019-02-03 NOTE — Progress Notes (Signed)
Virtual Visit via Video Note  I connected with Jamie Ruiz on 02/03/19 at  4:45 PM EST by a video enabled telemedicine application and verified that I am speaking with the correct person using two identifiers.   I discussed the limitations of evaluation and management by telemedicine and the availability of in person appointments. The patient expressed understanding and agreed to proceed.    I discussed the assessment and treatment plan with the patient. The patient was provided an opportunity to ask questions and all were answered. The patient agreed with the plan and demonstrated an understanding of the instructions.   The patient was advised to call back or seek an in-person evaluation if the symptoms worsen or if the condition fails to improve as anticipated.   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  Patient today reports she continues to make progress on the current medication regimen.  She denies any significant anxiety or depressive symptoms.  She reports sleep is improved on the doxepin.  Patient denies any suicidality, homicidality or perceptual disturbances.  She reports she continues to struggle with keeping up with her work, takes a lot of time to complete her task.  She reports she however has not made an appointment for a follow-up with her attention disorder specialist yet.  Patient reports with the COVID-19 pandemic and since she is currently working in person at school she has a lot to adjust to.  She however is coping okay.  She denies any other concerns today. Visit Diagnosis:    ICD-10-CM   1. GAD (generalized anxiety disorder)  F41.1   2. MDD (major depressive disorder),  recurrent, in full remission (HCC)  F33.42   3. Insomnia due to mental condition  F51.05   4. Attention deficit  R41.840     Past Psychiatric History: Reviewed past psychiatric history from my progress note on 03/23/2018.  Past trials of Zoloft.  Past Medical History:  Past Medical History:  Diagnosis Date  . Anxiety   . Depression   . Fainting spell   . GERD (gastroesophageal reflux disease)   . History of UTI   . Migraines     Past Surgical History:  Procedure Laterality Date  . UPPER GASTROINTESTINAL ENDOSCOPY    . UPPER GI ENDOSCOPY    . WISDOM TOOTH EXTRACTION  2017    Family Psychiatric History: Reviewed family psychiatric history from my progress note on 03/23/2018.  Family History:  Family History  Problem Relation Age of Onset  . Hypertension Father   . Hypertension Maternal Grandfather   . Lung cancer Maternal Grandfather   . Colon cancer Maternal Grandfather   . Pancreatic cancer Paternal Grandfather   . Syncope episode Mother   . Anxiety disorder Maternal Aunt   . Anxiety disorder Maternal Grandmother   . Anxiety disorder Cousin   . Depression Cousin   . ADD / ADHD Cousin     Social History: Reviewed social history from my progress note on 03/23/2018. Social History   Socioeconomic History  . Marital status: Single    Spouse name: Not on file  . Number of children: 0  . Years of education: Not on file  . Highest education level:  Some college, no degree  Occupational History  . Not on file  Tobacco Use  . Smoking status: Never Smoker  . Smokeless tobacco: Never Used  Substance and Sexual Activity  . Alcohol use: No    Alcohol/week: 0.0 standard drinks  . Drug use: No  . Sexual activity: Not Currently    Partners: Male    Birth control/protection: Pill  Other Topics Concern  . Not on file  Social History Narrative   Attends [employer].   Studying teaching.   Enjoys spending time with her friends.   Social Determinants of Health    Financial Resource Strain: Low Risk   . Difficulty of Paying Living Expenses: Not hard at all  Food Insecurity: No Food Insecurity  . Worried About Charity fundraiser in the Last Year: Never true  . Ran Out of Food in the Last Year: Never true  Transportation Needs: No Transportation Needs  . Lack of Transportation (Medical): No  . Lack of Transportation (Non-Medical): No  Physical Activity: Insufficiently Active  . Days of Exercise per Week: 1 day  . Minutes of Exercise per Session: 60 min  Stress: Stress Concern Present  . Feeling of Stress : Very much  Social Connections: Unknown  . Frequency of Communication with Friends and Family: Not on file  . Frequency of Social Gatherings with Friends and Family: Not on file  . Attends Religious Services: Never  . Active Member of Clubs or Organizations: Yes  . Attends Archivist Meetings: More than 4 times per year  . Marital Status: Never married    Allergies:  Allergies  Allergen Reactions  . Penicillins Hives  . Bactrim [Sulfamethoxazole-Trimethoprim] Rash    Metabolic Disorder Labs: No results found for: HGBA1C, MPG No results found for: PROLACTIN Lab Results  Component Value Date   CHOL 186 06/06/2017   TRIG 76.0 06/06/2017   HDL 38.30 (L) 06/06/2017   CHOLHDL 5 06/06/2017   VLDL 15.2 06/06/2017   LDLCALC 133 (H) 06/06/2017   Lab Results  Component Value Date   TSH 1.150 03/23/2018    Therapeutic Level Labs: No results found for: LITHIUM No results found for: VALPROATE No components found for:  CBMZ  Current Medications: Current Outpatient Medications  Medication Sig Dispense Refill  . doxepin (SINEQUAN) 10 MG capsule Take 1-2 capsules (10-20 mg total) by mouth at bedtime as needed. sleep 180 capsule 1  . levonorgestrel (MIRENA) 20 MCG/24HR IUD 1 each by Intrauterine route once.    . sertraline (ZOLOFT) 100 MG tablet Take 1.5 tablets (150 mg total) by mouth daily. 135 tablet 1   No current  facility-administered medications for this visit.     Musculoskeletal: Strength & Muscle Tone: UTA Gait & Station: normal Patient leans: N/A  Psychiatric Specialty Exam: Review of Systems  Psychiatric/Behavioral: Negative for agitation, behavioral problems, confusion, decreased concentration, dysphoric mood, hallucinations, self-injury, sleep disturbance and suicidal ideas. The patient is not nervous/anxious and is not hyperactive.   All other systems reviewed and are negative.   There were no vitals taken for this visit.There is no height or weight on file to calculate BMI.  General Appearance: Casual  Eye Contact:  Fair  Speech:  Clear and Coherent  Volume:  Normal  Mood:  Euthymic  Affect:  Congruent  Thought Process:  Goal Directed and Descriptions of Associations: Intact  Orientation:  Full (Time, Place, and Person)  Thought Content: Logical   Suicidal Thoughts:  No  Homicidal Thoughts:  No  Memory:  Immediate;   Fair Recent;   Fair Remote;   Fair  Judgement:  Fair  Insight:  Fair  Psychomotor Activity:  Normal  Concentration:  Concentration: Fair and Attention Span: Fair  Recall:  [employer] of Knowledge: Fair  Language: Fair  Akathisia:  No  Handed:  Right  AIMS (if indicated): Denies tremors, rigidity  Assets:  Communication Skills Desire for Improvement Housing Social Support  ADL's:  Intact  Cognition: WNL  Sleep:  Fair   Screenings: GAD-7     Office Visit from 07/25/2017 in [facility] at [facility] Visit from 06/13/2017 in [facility] at [facility]  Total GAD-7 Score  20  21    PHQ2-9     Office Visit from 07/25/2017 in [facility] Visit from 06/13/2017 in [facility] at 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  Patient continues to make progress, plan as noted below.  Plan MDD in remission Zoloft 150 mg p.o. daily  GAD-stable Zoloft as prescribed  Insomnia-stable Doxepin 10 to 20 mg p.o. nightly as needed Melatonin as needed  Attention concentration deficit-patient to continue to work with [place] attention specialist.  Follow-up in clinic in 2 to 3 months or sooner if needed.  March 29 at 4:40 PM  I have spent atleast 20 minutes non face to face with patient today. More than 50 % of the time was spent for ordering medications and test ,psychoeducation and supportive psychotherapy and care coordination,as well as documenting clinical information in electronic health record. This note was generated in part or whole with voice recognition software. Voice recognition is usually quite accurate but there are transcription errors that can and very often do occur. I apologize for any typographical errors that were not detected and corrected.       Jomarie Longs, MD 02/03/2019, 5:55 PM

## 2019-04-12 ENCOUNTER — Other Ambulatory Visit: Payer: Self-pay

## 2019-04-12 ENCOUNTER — Encounter: Payer: Self-pay | Admitting: Psychiatry

## 2019-04-12 ENCOUNTER — Ambulatory Visit (INDEPENDENT_AMBULATORY_CARE_PROVIDER_SITE_OTHER): Payer: BC Managed Care – PPO | Admitting: Psychiatry

## 2019-04-12 DIAGNOSIS — F5105 Insomnia due to other mental disorder: Secondary | ICD-10-CM

## 2019-04-12 DIAGNOSIS — F3342 Major depressive disorder, recurrent, in full remission: Secondary | ICD-10-CM | POA: Diagnosis not present

## 2019-04-12 DIAGNOSIS — R41840 Attention and concentration deficit: Secondary | ICD-10-CM | POA: Diagnosis not present

## 2019-04-12 DIAGNOSIS — F411 Generalized anxiety disorder: Secondary | ICD-10-CM

## 2019-04-12 NOTE — Progress Notes (Signed)
Provider Location : [facility] Patient Location : Home  Virtual Visit via Video Note  I connected with Jamie Ruiz on 04/12/19 at  4:40 PM EDT by a video enabled telemedicine application and verified that I am speaking with the correct person using two identifiers.   I discussed the limitations of evaluation and management by telemedicine and the availability of in person appointments. The patient expressed understanding and agreed to proceed.     I discussed the assessment and treatment plan with the patient. The patient was provided an opportunity to ask questions and all were answered. The patient agreed with the plan and demonstrated an understanding of the instructions.   The patient was advised to call back or seek an in-person evaluation if the symptoms worsen or if the condition fails to improve as anticipated.   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  Patient today reports she is currently doing okay on the current medication regimen.  She does have anxiety symptoms due to her situational stressors.  She reports since students are going to be coming back for  in person learning, it is going to be stressful.  She is currently on spring break and reports she is going to take this time to relax.  She is compliant on her medications as prescribed.  Patient denies any suicidality, homicidality or perceptual disturbances.  Patient reports she is planning on moving to [place] to find a job when she graduates in May.  She looks forward to that.  She currently has bilateral knee pain and is currently following up with providers for the same.  She reports she has bone growth and meniscal tear which may have  happened due to playing tennis.  She has been dealing with this since the past 10 years.  She denies any other concerns today. Visit Diagnosis:    ICD-10-CM   1. GAD (generalized anxiety disorder)  F41.1   2. MDD (major depressive disorder), recurrent, in full remission (HCC)  F33.42   3. Insomnia due to mental condition  F51.05   4. Attention deficit  R41.840     Past Psychiatric History: I have reviewed past psychiatric history from my progress note on 03/23/2018.  Past trials of Zoloft.  Past Medical History:  Past Medical History:  Diagnosis Date  . Anxiety   . Depression   . Fainting spell   . GERD (gastroesophageal reflux disease)   . History of UTI   . Migraines     Past Surgical History:  Procedure Laterality Date  . UPPER GASTROINTESTINAL ENDOSCOPY    . UPPER GI ENDOSCOPY    . WISDOM TOOTH EXTRACTION  2017    Family Psychiatric History: I have reviewed family psychiatric history from my progress note on 03/23/2018.  Family History:  Family History  Problem Relation Age of Onset  . Hypertension Father   . Hypertension Maternal Grandfather   . Lung cancer Maternal Grandfather   . Colon cancer Maternal Grandfather   . Pancreatic cancer Paternal Grandfather   . Syncope episode Mother   . Anxiety disorder Maternal Aunt   . Anxiety disorder Maternal Grandmother   . Anxiety disorder Cousin   . Depression Cousin   .  ADD / ADHD Cousin     Social History: I have reviewed social history from my progress note on 03/23/2018. Social History   Socioeconomic History  . Marital status: Single    Spouse name: Not on file  . Number of children: 0  . Years of education: Not on file  . Highest education level: Some college, no degree  Occupational History  . Not on file  Tobacco Use  . Smoking status: Never Smoker  . Smokeless tobacco: Never Used  Substance and Sexual Activity  . Alcohol use: No    Alcohol/week: 0.0 standard drinks  . Drug use: No  . Sexual activity:  Not Currently    Partners: Male    Birth control/protection: Pill  Other Topics Concern  . Not on file  Social History Narrative   Attends [employer].   Studying teaching.   Enjoys spending time with her friends.   Social Determinants of Health   Financial Resource Strain:   . Difficulty of Paying Living Expenses:   Food Insecurity:   . Worried About Programme researcher, broadcasting/film/video in the Last Year:   . Barista in the Last Year:   Transportation Needs:   . Freight forwarder (Medical):   Marland Kitchen Lack of Transportation (Non-Medical):   Physical Activity:   . Days of Exercise per Week:   . Minutes of Exercise per Session:   Stress:   . Feeling of Stress :   Social Connections:   . Frequency of Communication with Friends and Family:   . Frequency of Social Gatherings with Friends and Family:   . Attends Religious Services:   . Active Member of Clubs or Organizations:   . Attends Banker Meetings:   Marland Kitchen Marital Status:     Allergies:  Allergies  Allergen Reactions  . Penicillins Hives  . Bactrim [Sulfamethoxazole-Trimethoprim] Rash    Metabolic Disorder Labs: No results found for: HGBA1C, MPG No results found for: PROLACTIN Lab Results  Component Value Date   CHOL 186 06/06/2017   TRIG 76.0 06/06/2017   HDL 38.30 (L) 06/06/2017   CHOLHDL 5 06/06/2017   VLDL 15.2 06/06/2017   LDLCALC 133 (H) 06/06/2017   Lab Results  Component Value Date   TSH 1.150 03/23/2018    Therapeutic Level Labs: No results found for: LITHIUM No results found for: VALPROATE No components found for:  CBMZ  Current Medications: Current Outpatient Medications  Medication Sig Dispense Refill  . doxepin (SINEQUAN) 10 MG capsule Take 1-2 capsules (10-20 mg total) by mouth at bedtime as needed. sleep 180 capsule 1  . levonorgestrel (MIRENA) 20 MCG/24HR IUD 1 each by Intrauterine route once.    . sertraline (ZOLOFT) 100 MG tablet Take 1.5 tablets (150 mg total) by mouth  daily. 135 tablet 1   No current facility-administered medications for this visit.     Musculoskeletal: Strength & Muscle Tone: UTA Gait & Station: normal Patient leans: N/A  Psychiatric Specialty Exam: Review of Systems  Musculoskeletal:       Knee pain - BL  Psychiatric/Behavioral: The patient is nervous/anxious (Coping well).     There were no vitals taken for this visit.There is no height or weight on file to calculate BMI.  General Appearance: Casual  Eye Contact:  Fair  Speech:  Clear and Coherent  Volume:  Normal  Mood:  Anxious coping okay  Affect:  Congruent  Thought Process:  Goal Directed and Descriptions of Associations: Intact  Orientation:  Full (Time, Place, and Person)  Thought Content: Logical   Suicidal Thoughts:  No  Homicidal Thoughts:  No  Memory:  Immediate;   Fair Recent;   Fair Remote;   Fair  Judgement:  Fair  Insight:  Fair  Psychomotor Activity:  Normal  Concentration:  Concentration: Fair and Attention Span: Fair  Recall:  [employer] of Knowledge: Fair  Language: Fair  Akathisia:  No  Handed:  Right  AIMS (if indicated): UTA  Assets:  Communication Skills Desire for [place] Talents/Skills Transportation Vocational/Educational  ADL's:  Intact  Cognition: WNL  Sleep:  Fair   Screenings: GAD-7     Office Visit from 07/25/2017 in [place] at [facility] Visit from 06/13/2017 in [place] at [facility]  Total GAD-7 Score  20  21    PHQ2-9     Office Visit from 07/25/2017 in [place] at [facility] Visit from 06/13/2017 in [place] at [facility] Total Score  6  6  PHQ-9 Total Score  22  21       Assessment and Plan: Casimer Bilis 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  Patient is currently having situational stressors of the pandemic as well  as being a Pharmacist, hospital.  She however is coping okay.  Plan as noted below.  Plan MDD in remission Zoloft 150 mg p.o. daily  GAD-stable Zoloft as prescribed  Insomnia-stable Doxepin 10 to 20 mg p.o. nightly as needed Melatonin as needed  Attention/concentration deficit-patient was referred to [place] tension specialist that she can follow-up with them.  Patient is moving to [place], she would like to follow-up with Probation officer once more.  She will then be able to transition her care to her new primary care provider in [place].  Follow-up in clinic in 2 months or sooner if needed.  I have spent atleast 20 minutes non face to face with patient today. More than 50 % of the time was spent for preparing to see the patient ( e.g., review of test, records ), ordering medications and test ,psychoeducation and supportive psychotherapy and care coordination,as well as documenting clinical information in electronic health record. This note was generated in part or whole with voice recognition software. Voice recognition is usually quite accurate but there are transcription errors that can and very often do occur. I apologize for any typographical errors that were not detected and corrected.       Ursula Alert, MD 04/12/2019, 5:17 PM

## 2019-05-31 ENCOUNTER — Telehealth: Payer: BC Managed Care – PPO | Admitting: Psychiatry

## 2019-05-31 ENCOUNTER — Other Ambulatory Visit: Payer: Self-pay

## 2019-07-06 ENCOUNTER — Encounter: Payer: Self-pay | Admitting: Psychiatry

## 2019-07-06 ENCOUNTER — Other Ambulatory Visit: Payer: Self-pay

## 2019-07-06 ENCOUNTER — Telehealth (INDEPENDENT_AMBULATORY_CARE_PROVIDER_SITE_OTHER): Payer: BC Managed Care – PPO | Admitting: Psychiatry

## 2019-07-06 DIAGNOSIS — F411 Generalized anxiety disorder: Secondary | ICD-10-CM

## 2019-07-06 DIAGNOSIS — F3342 Major depressive disorder, recurrent, in full remission: Secondary | ICD-10-CM

## 2019-07-06 DIAGNOSIS — F5105 Insomnia due to other mental disorder: Secondary | ICD-10-CM

## 2019-07-06 MED ORDER — DOXEPIN HCL 10 MG PO CAPS: 10.0000 mg | ORAL_CAPSULE | Freq: Every evening | ORAL | 1 refills | Status: AC | PRN

## 2019-07-06 MED ORDER — SERTRALINE HCL 100 MG PO TABS: 150.0000 mg | ORAL_TABLET | Freq: Every day | ORAL | 1 refills | Status: AC

## 2019-07-06 MED ORDER — HYDROXYZINE HCL 25 MG PO TABS: 12.5000 mg | ORAL_TABLET | Freq: Two times a day (BID) | ORAL | 2 refills | Status: AC | PRN

## 2019-07-06 NOTE — Progress Notes (Signed)
Provider Location : [facility] Patient Location : Home  Virtual Visit via Video Note  I connected with Jamie Ruiz on 07/06/19 at  4:20 PM EDT by a video enabled telemedicine application and verified that I am speaking with the correct person using two identifiers.   I discussed the limitations of evaluation and management by telemedicine and the availability of in person appointments. The patient expressed understanding and agreed to proceed.   I discussed the assessment and treatment plan with the patient. The patient was provided an opportunity to ask questions and all were answered. The patient agreed with the plan and demonstrated an understanding of the instructions.   The patient was advised to call back or seek an in-person evaluation if the symptoms worsen or if the condition fails to improve as anticipated.  [facility] MD OP Progress Note  07/06/2019 8:43 PM Cyrena Kuchenbecker  MRN:  [id number]  Chief Complaint:  Chief Complaint    Follow-up     HPI: Jamie Ruiz is a 24 year old Caucasian female, single, has a history of depression, anxiety, insomnia, attention deficit was evaluated by telemedicine today.  Patient today reports she is currently struggling with anxiety symptoms due to recent psychosocial stressors.  She reports her dad was recently diagnosed with cancer of his tonsils and is currently undergoing chemotherapy and radiation therapy.  This does make her anxious and sad.  She however reports she is tolerating the current medication regimen well and is not interested in dosage increase of her Zoloft.  She is trying to find a new therapist.  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  She is hence trying to find a therapist in [place].  She does worry about her dad however overall her anxiety symptoms are under control on the Zoloft.  Patient reports sleep overall is okay as long as she is able to sleep on her bed and not traveling.  She has  been using the doxepin as well as aromatherapy for sleep which helps.  She however reports the last few days she was traveling and hence that definitely did affect her sleep.  Patient denies any suicidality, homicidality or perceptual disturbances.  Patient denies any other concerns today.  Visit Diagnosis:    ICD-10-CM   1. MDD (major depressive disorder), recurrent, in full remission ([place])  F33.42 sertraline (ZOLOFT) 100 MG tablet  2. GAD (generalized anxiety disorder)  F41.1 hydrOXYzine (ATARAX/VISTARIL) 25 MG tablet  3. Insomnia due to mental condition  F51.05 doxepin (SINEQUAN) 10 MG capsule    Past Psychiatric History: I have reviewed past psychiatric history from my progress note on 03/23/2018.  Past trials of Zoloft  Past Medical History:  Past Medical History:  Diagnosis Date  . Anxiety   . Depression   . Fainting spell   . GERD (gastroesophageal reflux disease)   . History of UTI   . Migraines     Past Surgical History:  Procedure Laterality Date  . UPPER GASTROINTESTINAL ENDOSCOPY    . UPPER GI ENDOSCOPY    . WISDOM TOOTH EXTRACTION  2017    Family Psychiatric History: I have reviewed family psychiatric history from my progress note on 03/23/2018  Family History:  Family History  Problem Relation Age of Onset  . Hypertension Father   . Hypertension Maternal Grandfather   . Lung cancer Maternal Grandfather   . Colon cancer Maternal Grandfather   . Pancreatic cancer Paternal Grandfather   . Syncope episode  Mother   . Anxiety disorder Maternal Aunt   . Anxiety disorder Maternal Grandmother   . Anxiety disorder Cousin   . Depression Cousin   . ADD / ADHD Cousin     Social History: Reviewed social history from my progress note on 03/23/2018 Social History   Socioeconomic History  . Marital status: Single    Spouse name: Not on file  . Number of children: 0  . Years of education: Not on file  . Highest education level: Some college, no degree  Occupational  History  . Not on file  Tobacco Use  . Smoking status: Never Smoker  . Smokeless tobacco: Never Used  Vaping Use  . Vaping Use: Never used  Substance and Sexual Activity  . Alcohol use: No    Alcohol/week: 0.0 standard drinks  . Drug use: No  . Sexual activity: Not Currently    Partners: Male    Birth control/protection: Pill  Other Topics Concern  . Not on file  Social History Narrative   Attends [employer].   Studying teaching.   Enjoys spending time with her friends.   Social Determinants of Health   Financial Resource Strain:   . Difficulty of Paying Living Expenses:   Food Insecurity:   . Worried About Programme researcher, broadcasting/film/video in the Last Year:   . Barista in the Last Year:   Transportation Needs:   . Freight forwarder (Medical):   Marland Kitchen Lack of Transportation (Non-Medical):   Physical Activity:   . Days of Exercise per Week:   . Minutes of Exercise per Session:   Stress:   . Feeling of Stress :   Social Connections:   . Frequency of Communication with Friends and Family:   . Frequency of Social Gatherings with Friends and Family:   . Attends Religious Services:   . Active Member of Clubs or Organizations:   . Attends Banker Meetings:   Marland Kitchen Marital Status:     Allergies:  Allergies  Allergen Reactions  . Penicillins Hives  . Bactrim [Sulfamethoxazole-Trimethoprim] Rash    Metabolic Disorder Labs: No results found for: HGBA1C, MPG No results found for: PROLACTIN Lab Results  Component Value Date   CHOL 186 06/06/2017   TRIG 76.0 06/06/2017   HDL 38.30 (L) 06/06/2017   CHOLHDL 5 06/06/2017   VLDL 15.2 06/06/2017   LDLCALC 133 (H) 06/06/2017   Lab Results  Component Value Date   TSH 1.150 03/23/2018    Therapeutic Level Labs: No results found for: LITHIUM No results found for: VALPROATE No components found for:  CBMZ  Current Medications: Current Outpatient Medications  Medication Sig Dispense Refill  .  doxepin (SINEQUAN) 10 MG capsule Take 1-2 capsules (10-20 mg total) by mouth at bedtime as needed. sleep 180 capsule 1  . hydrOXYzine (ATARAX/VISTARIL) 25 MG tablet Take 0.5-1 tablets (12.5-25 mg total) by mouth 2 (two) times daily as needed (anxiety and sleep). 60 tablet 2  . levonorgestrel (MIRENA) 20 MCG/24HR IUD 1 each by Intrauterine route once.    . sertraline (ZOLOFT) 100 MG tablet Take 1.5 tablets (150 mg total) by mouth daily. 135 tablet 1   No current facility-administered medications for this visit.     Musculoskeletal: Strength & Muscle Tone: UTA Gait & Station: normal Patient leans: N/A  Psychiatric Specialty Exam: Review of Systems  Psychiatric/Behavioral: Positive for sleep disturbance. The patient is nervous/anxious.   All other systems reviewed and are negative.  There were no vitals taken for this visit.There is no height or weight on file to calculate BMI.  General Appearance: Casual  Eye Contact:  Fair  Speech:  Clear and Coherent  Volume:  Normal  Mood:  Anxious situational  Affect:  Congruent  Thought Process:  Goal Directed and Descriptions of Associations: Intact  Orientation:  Full (Time, Place, and Person)  Thought Content: Logical   Suicidal Thoughts:  No  Homicidal Thoughts:  No  Memory:  Immediate;   Fair Recent;   Fair Remote;   Fair  Judgement:  Fair  Insight:  Fair  Psychomotor Activity:  Normal  Concentration:  Concentration: Fair and Attention Span: Fair  Recall:  [employer] of Knowledge: Fair  Language: Fair  Akathisia:  No  Handed:  Right  AIMS (if indicated): UTA  Assets:  Communication Skills Desire for Improvement Housing Social Support Talents/Skills Transportation Vocational/Educational  ADL's:  Intact  Cognition: WNL  Sleep:  Restless   Screenings: GAD-7     Office Visit from 07/25/2017 in [facility] Visit from 06/13/2017 in [facility] at [facility]  Total GAD-7 Score 20 21     PHQ2-9     Video Visit from 07/06/2019 in [facility] Office Visit from 07/25/2017 in [facility] Visit from 06/13/2017 in [facility] Total Score 2 6 6   PHQ-9 Total Score 10 22 21        Assessment and Plan: Jamie Ruiz 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  Patient is currently struggling with situational stressors of relocation, her dad being diagnosed with cancer.  Patient however is tolerating her current medication regimen well.  Discussed plan as noted below.  Plan MDD in remission Zoloft 150 mg p.o. daily  GAD-stable However for breakthrough anxiety due to current situational stressors will start hydroxyzine 12.5 to 25 mg twice a day as needed. Zoloft as prescribed  Insomnia-restless Doxepin 10 to 20 mg p.o. nightly as needed Continue aromatherapy. She can also use hydroxyzine as needed  Patient advised to start psychotherapy sessions.  Patient to sign a release to release medical records to her new provider in [name].  Follow-up in clinic as needed.  I have spent atleast 20 minutes non face to face with patient today. More than 50 % of the time was spent for preparing to see the patient ( e.g., review of test, records ),  ordering medications and test ,psychoeducation and supportive psychotherapy and care coordination,as well as documenting clinical information in electronic health record. This note was generated in part or whole with voice recognition software. Voice recognition is usually quite accurate but there are transcription errors that can and very often do occur. I apologize for any typographical errors that were not detected and corrected.        30, MD 07/06/2019, 8:43 PM

## 2021-03-04 IMAGING — DX DG SHOULDER 2+V*R*
3 series · 3 of 3 positions shown · non-contrast
Comparison: None

CLINICAL DATA: Chronic RIGHT shoulder pain

EXAM:
RIGHT SHOULDER - 2+ VIEW

[shoulder axial]
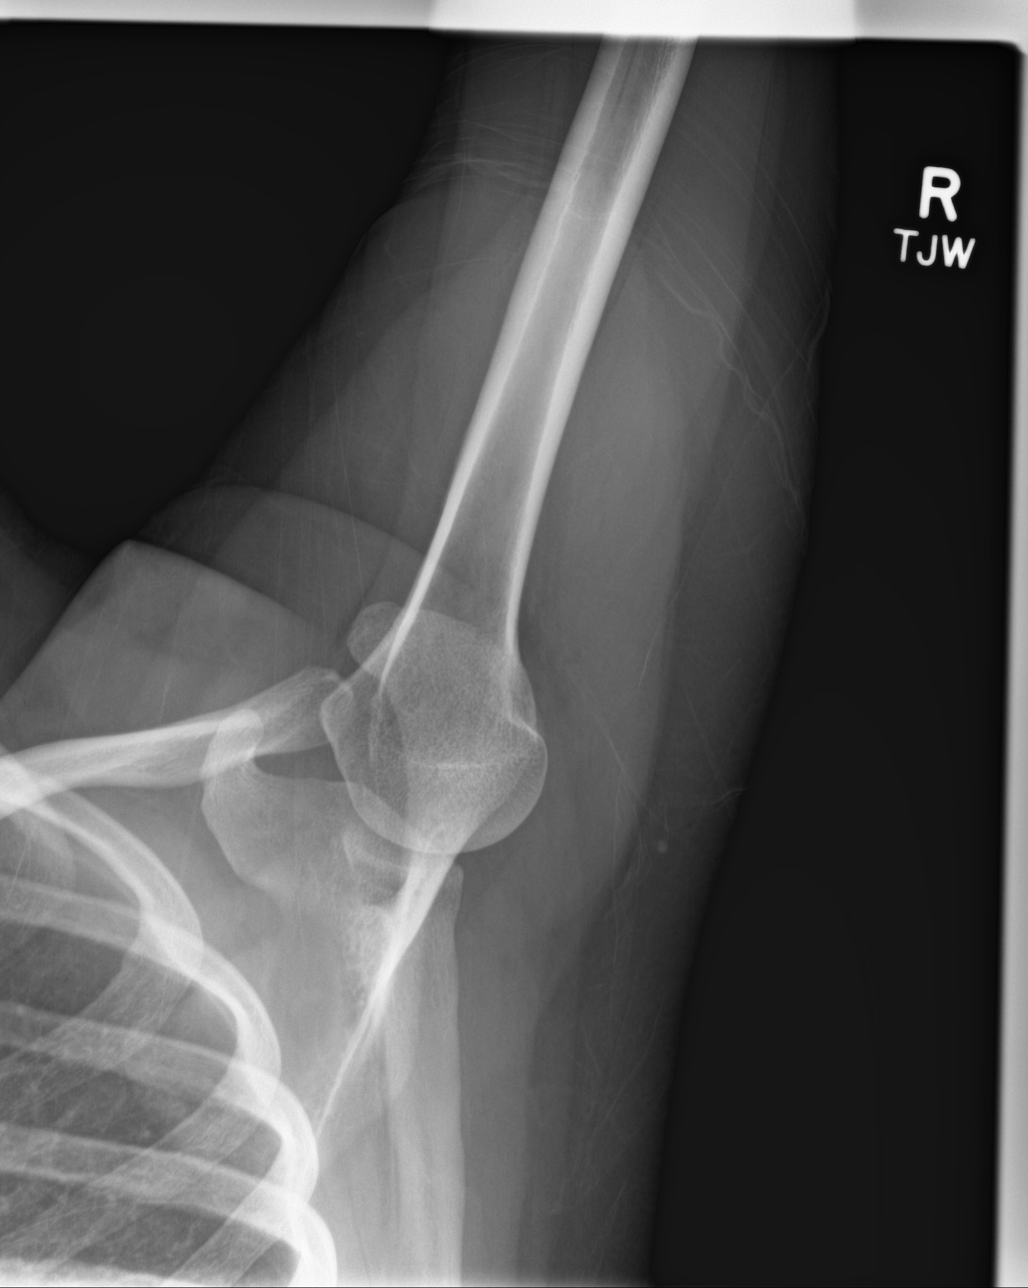

[shoulder ap]
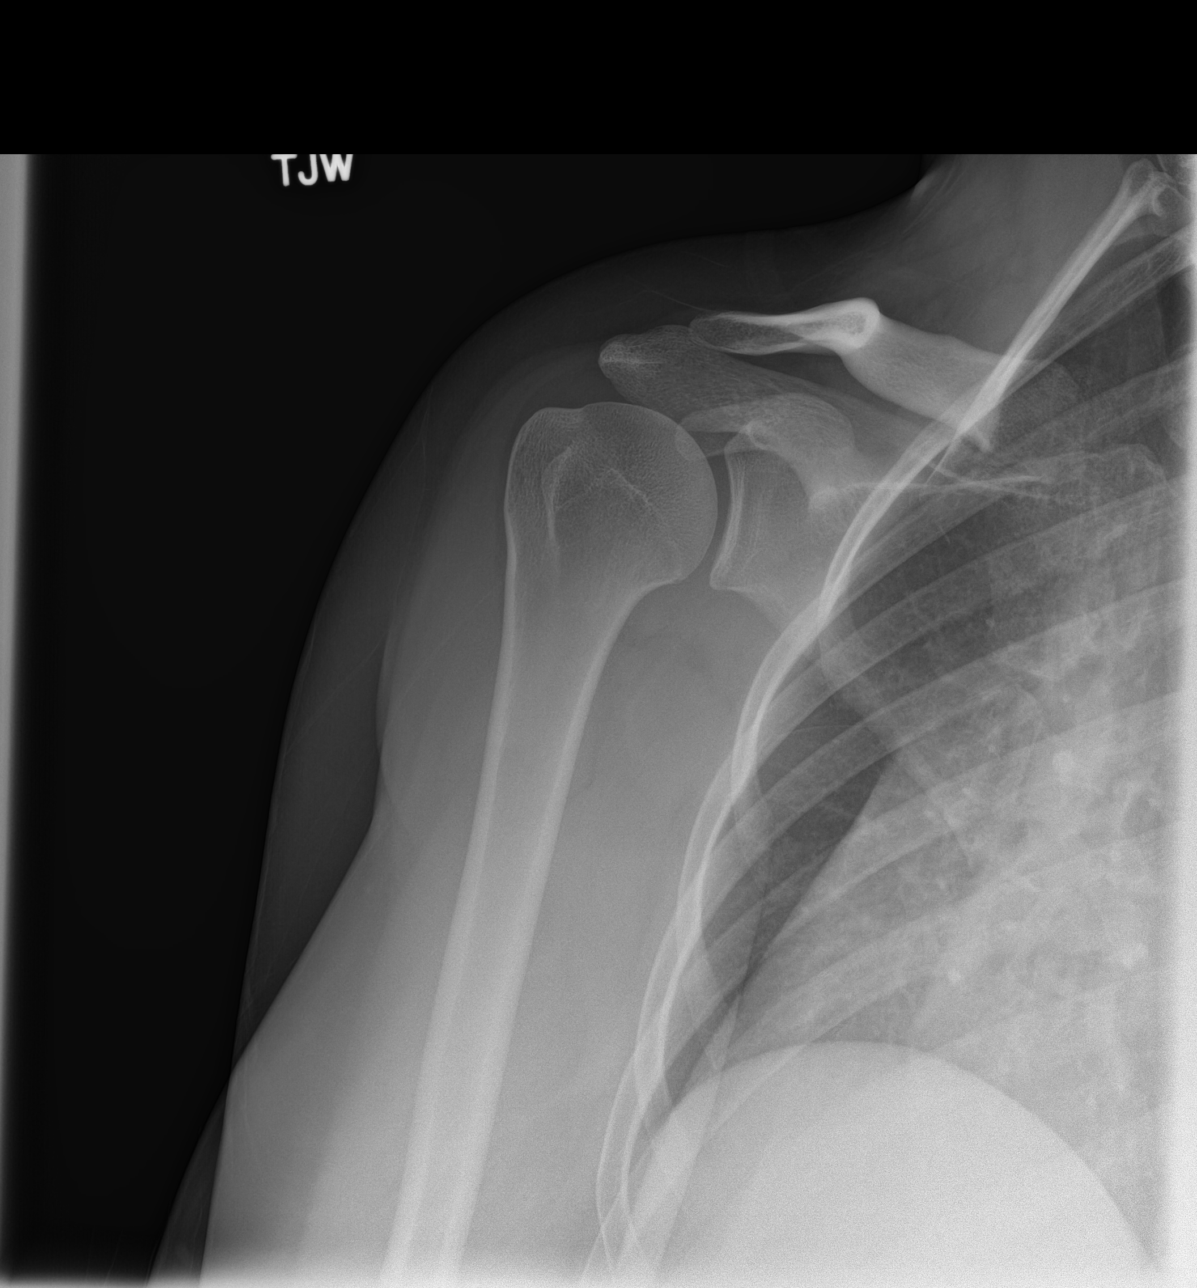

[shoulder y-view]
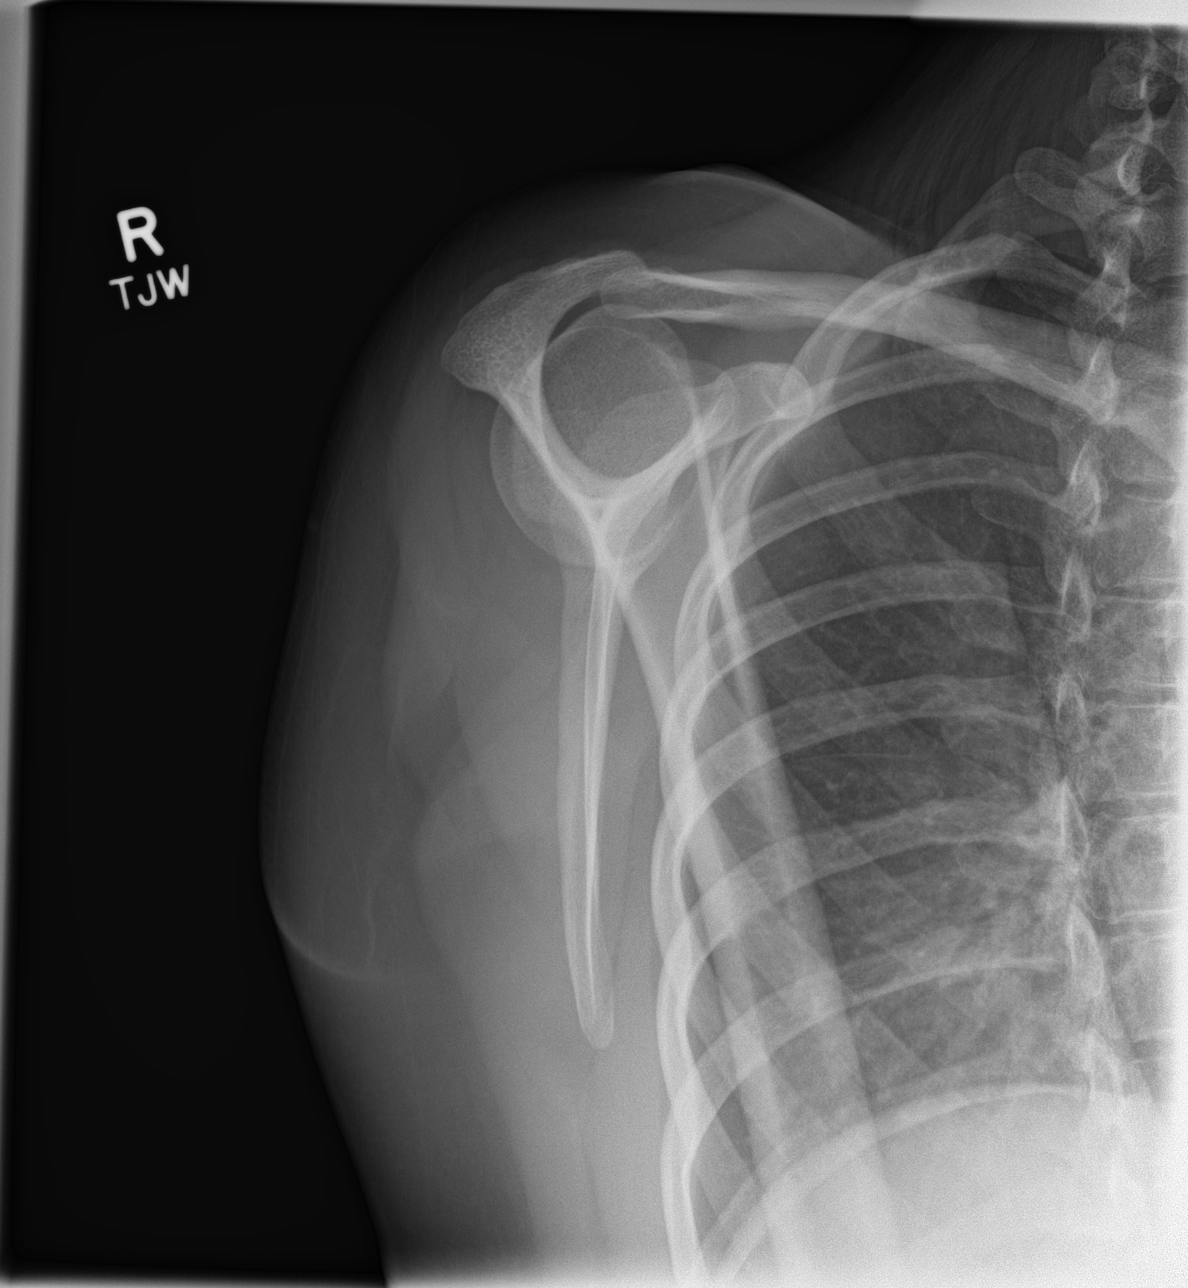

[3 of 3 positions shown; findings below may reference images not displayed]

FINDINGS: Osseous mineralization normal.

AC joint alignment normal.

No acute fracture, dislocation or bone destruction.

Visualized ribs unremarkable.
IMPRESSION: Normal exam.
